# Patient Record
Sex: Male | Born: 1940 | Race: White | Hispanic: No | Marital: Married | Smoking: Never smoker
Health system: Southern US, Community
[De-identification: ages and names within clinical notes are randomized; demographics above are authoritative.]

## PROBLEM LIST (undated history)

## (undated) DIAGNOSIS — H269 Unspecified cataract: Secondary | ICD-10-CM

## (undated) DIAGNOSIS — I10 Essential (primary) hypertension: Secondary | ICD-10-CM

## (undated) DIAGNOSIS — B958 Unspecified staphylococcus as the cause of diseases classified elsewhere: Secondary | ICD-10-CM

## (undated) DIAGNOSIS — H547 Unspecified visual loss: Secondary | ICD-10-CM

## (undated) DIAGNOSIS — I639 Cerebral infarction, unspecified: Secondary | ICD-10-CM

## (undated) DIAGNOSIS — I4891 Unspecified atrial fibrillation: Secondary | ICD-10-CM

## (undated) DIAGNOSIS — E785 Hyperlipidemia, unspecified: Secondary | ICD-10-CM

## (undated) DIAGNOSIS — G4730 Sleep apnea, unspecified: Secondary | ICD-10-CM

## (undated) DIAGNOSIS — H9190 Unspecified hearing loss, unspecified ear: Secondary | ICD-10-CM

## (undated) DIAGNOSIS — Z9079 Acquired absence of other genital organ(s): Secondary | ICD-10-CM

## (undated) DIAGNOSIS — Q282 Arteriovenous malformation of cerebral vessels: Secondary | ICD-10-CM

## (undated) HISTORY — DX: Hyperlipidemia, unspecified: E78.5

## (undated) HISTORY — PX: CARDIAC CATHETERIZATION: SHX172

## (undated) HISTORY — DX: Cerebral infarction, unspecified: I63.9

## (undated) HISTORY — DX: Unspecified atrial fibrillation: I48.91

## (undated) HISTORY — DX: Unspecified hearing loss, unspecified ear: H91.90

## (undated) HISTORY — PX: OTHER SURGICAL HISTORY: SHX169

## (undated) HISTORY — PX: PROSTATECTOMY: SHX69

## (undated) HISTORY — DX: Unspecified visual loss: H54.7

## (undated) HISTORY — DX: Essential (primary) hypertension: I10

---
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
## 2009-04-06 HISTORY — PX: BRAIN AVM REPAIR: SHX202

## 2011-07-12 ENCOUNTER — Emergency Department: Payer: Self-pay | Admitting: Emergency Medicine

## 2011-07-14 ENCOUNTER — Emergency Department: Payer: Self-pay | Admitting: Emergency Medicine

## 2011-07-14 LAB — CBC
HGB: 15.6 g/dL (ref 13.0–18.0)
Platelet: 126 10*3/uL — ABNORMAL LOW (ref 150–440)
RBC: 4.64 10*6/uL (ref 4.40–5.90)
RDW: 11.9 % (ref 11.5–14.5)

## 2011-07-14 LAB — COMPREHENSIVE METABOLIC PANEL
Anion Gap: 7 (ref 7–16)
Bilirubin,Total: 1.1 mg/dL — ABNORMAL HIGH (ref 0.2–1.0)
Calcium, Total: 9.2 mg/dL (ref 8.5–10.1)
Chloride: 94 mmol/L — ABNORMAL LOW (ref 98–107)
EGFR (Non-African Amer.): >60
Potassium: 4.6 mmol/L (ref 3.5–5.1)
SGOT(AST): 45 U/L — ABNORMAL HIGH (ref 15–37)
SGPT (ALT): 26 U/L
Sodium: 131 mmol/L — ABNORMAL LOW (ref 136–145)

## 2011-07-14 LAB — TROPONIN I: Troponin-I: 0.03 ng/mL

## 2012-04-06 HISTORY — PX: BRAIN AVM REPAIR: SHX202

## 2013-08-16 ENCOUNTER — Ambulatory Visit: Payer: Self-pay | Admitting: Ophthalmology

## 2014-02-04 DIAGNOSIS — I639 Cerebral infarction, unspecified: Secondary | ICD-10-CM

## 2014-02-04 HISTORY — DX: Cerebral infarction, unspecified: I63.9

## 2014-03-05 ENCOUNTER — Emergency Department (HOSPITAL_COMMUNITY): Payer: Medicare HMO

## 2014-03-05 ENCOUNTER — Inpatient Hospital Stay (HOSPITAL_COMMUNITY)
Admission: EM | Admit: 2014-03-05 | Discharge: 2014-03-07 | DRG: 064 | Disposition: A | Discharge status: Skilled Nursing Facility | Payer: Medicare HMO | Attending: Internal Medicine | Admitting: Internal Medicine

## 2014-03-05 ENCOUNTER — Inpatient Hospital Stay (HOSPITAL_COMMUNITY): Payer: Medicare HMO

## 2014-03-05 ENCOUNTER — Encounter (HOSPITAL_COMMUNITY): Payer: Self-pay | Admitting: Emergency Medicine

## 2014-03-05 DIAGNOSIS — H540 Blindness, both eyes: Secondary | ICD-10-CM | POA: Diagnosis present

## 2014-03-05 DIAGNOSIS — Z833 Family history of diabetes mellitus: Secondary | ICD-10-CM | POA: Diagnosis not present

## 2014-03-05 DIAGNOSIS — R200 Anesthesia of skin: Secondary | ICD-10-CM

## 2014-03-05 DIAGNOSIS — E669 Obesity, unspecified: Secondary | ICD-10-CM | POA: Diagnosis present

## 2014-03-05 DIAGNOSIS — I10 Essential (primary) hypertension: Secondary | ICD-10-CM

## 2014-03-05 DIAGNOSIS — I739 Peripheral vascular disease, unspecified: Secondary | ICD-10-CM | POA: Diagnosis present

## 2014-03-05 DIAGNOSIS — I6523 Occlusion and stenosis of bilateral carotid arteries: Secondary | ICD-10-CM | POA: Diagnosis present

## 2014-03-05 DIAGNOSIS — R202 Paresthesia of skin: Secondary | ICD-10-CM | POA: Diagnosis present

## 2014-03-05 DIAGNOSIS — Z9079 Acquired absence of other genital organ(s): Secondary | ICD-10-CM | POA: Diagnosis present

## 2014-03-05 DIAGNOSIS — Z820 Family history of epilepsy and other diseases of the nervous system: Secondary | ICD-10-CM

## 2014-03-05 DIAGNOSIS — Q2730 Arteriovenous malformation, site unspecified: Secondary | ICD-10-CM

## 2014-03-05 DIAGNOSIS — E785 Hyperlipidemia, unspecified: Secondary | ICD-10-CM | POA: Diagnosis present

## 2014-03-05 DIAGNOSIS — I6340 Cerebral infarction due to embolism of unspecified cerebral artery: Secondary | ICD-10-CM | POA: Diagnosis not present

## 2014-03-05 DIAGNOSIS — Z8673 Personal history of transient ischemic attack (TIA), and cerebral infarction without residual deficits: Secondary | ICD-10-CM

## 2014-03-05 DIAGNOSIS — G4733 Obstructive sleep apnea (adult) (pediatric): Secondary | ICD-10-CM

## 2014-03-05 DIAGNOSIS — I639 Cerebral infarction, unspecified: Secondary | ICD-10-CM

## 2014-03-05 DIAGNOSIS — Z6830 Body mass index (BMI) 30.0-30.9, adult: Secondary | ICD-10-CM | POA: Diagnosis not present

## 2014-03-05 DIAGNOSIS — G459 Transient cerebral ischemic attack, unspecified: Secondary | ICD-10-CM

## 2014-03-05 DIAGNOSIS — Q282 Arteriovenous malformation of cerebral vessels: Secondary | ICD-10-CM

## 2014-03-05 DIAGNOSIS — G453 Amaurosis fugax: Secondary | ICD-10-CM

## 2014-03-05 HISTORY — DX: Arteriovenous malformation of cerebral vessels: Q28.2

## 2014-03-05 HISTORY — DX: Acquired absence of other genital organ(s): Z90.79

## 2014-03-05 HISTORY — DX: Unspecified staphylococcus as the cause of diseases classified elsewhere: B95.8

## 2014-03-05 HISTORY — DX: Unspecified visual loss: H54.7

## 2014-03-05 HISTORY — DX: Unspecified cataract: H26.9

## 2014-03-05 LAB — RAPID URINE DRUG SCREEN, HOSP PERFORMED
Amphetamines: NOT DETECTED
Barbiturates: NOT DETECTED
Benzodiazepines: NOT DETECTED
Cocaine: NOT DETECTED
Opiates: NOT DETECTED
Tetrahydrocannabinol: NOT DETECTED

## 2014-03-05 LAB — URINALYSIS, ROUTINE W REFLEX MICROSCOPIC
Bilirubin Urine: NEGATIVE
Glucose, UA: NEGATIVE mg/dL
Hgb urine dipstick: NEGATIVE
Ketones, ur: NEGATIVE mg/dL
Leukocytes, UA: NEGATIVE
Nitrite: NEGATIVE
Protein, ur: NEGATIVE mg/dL
Specific Gravity, Urine: 1.014 (ref 1.005–1.030)
Urobilinogen, UA: 0.2 mg/dL (ref 0.0–1.0)
pH: 6 (ref 5.0–8.0)

## 2014-03-05 LAB — CBC WITH DIFFERENTIAL/PLATELET
Basophils Absolute: 0 10*3/uL (ref 0.0–0.1)
Basophils Relative: 0 % (ref 0–1)
Eosinophils Absolute: 0.1 10*3/uL (ref 0.0–0.7)
Eosinophils Relative: 2 % (ref 0–5)
HCT: 47.9 % (ref 39.0–52.0)
Hemoglobin: 16.7 g/dL (ref 13.0–17.0)
Lymphocytes Relative: 29 % (ref 12–46)
Lymphs Abs: 1.9 10*3/uL (ref 0.7–4.0)
MCH: 33.3 pg (ref 26.0–34.0)
MCHC: 34.9 g/dL (ref 30.0–36.0)
MCV: 95.4 fL (ref 78.0–100.0)
Monocytes Absolute: 0.7 10*3/uL (ref 0.1–1.0)
Monocytes Relative: 11 % (ref 3–12)
Neutro Abs: 3.6 10*3/uL (ref 1.7–7.7)
Neutrophils Relative %: 58 % (ref 43–77)
Platelets: 168 10*3/uL (ref 150–400)
RBC: 5.02 MIL/uL (ref 4.22–5.81)
RDW: 12.2 % (ref 11.5–15.5)
WBC: 6.4 10*3/uL (ref 4.0–10.5)

## 2014-03-05 LAB — BASIC METABOLIC PANEL
Anion gap: 10 (ref 5–15)
BUN: 23 mg/dL (ref 6–23)
CO2: 28 mEq/L (ref 19–32)
Calcium: 9.3 mg/dL (ref 8.4–10.5)
Chloride: 102 mEq/L (ref 96–112)
Creatinine, Ser: 0.98 mg/dL (ref 0.50–1.35)
GFR calc Af Amer: >90 mL/min (ref >90)
GFR calc non Af Amer: 81 mL/min — ABNORMAL LOW (ref >90)
Glucose, Bld: 93 mg/dL (ref 70–99)
Potassium: 5 mEq/L (ref 3.7–5.3)
Sodium: 140 mEq/L (ref 137–147)

## 2014-03-05 LAB — CBG MONITORING, ED: Glucose-Capillary: 98 mg/dL (ref 70–99)

## 2014-03-05 LAB — ETHANOL: Alcohol, Ethyl (B): <11 mg/dL (ref 0–11)

## 2014-03-05 MED ORDER — IOHEXOL 350 MG/ML SOLN
80.0000 mL | Freq: Once | INTRAVENOUS | Status: AC | PRN
  Administered 2014-03-05: 80 mL via INTRAVENOUS

## 2014-03-05 MED ORDER — ASPIRIN 325 MG PO TABS
325.0000 mg | ORAL_TABLET | Freq: Every day | ORAL | Status: DC
  Administered 2014-03-06 – 2014-03-07 (×2): 325 mg via ORAL
  Filled 2014-03-05 (×2): qty 1

## 2014-03-05 MED ORDER — HEPARIN SODIUM (PORCINE) 5000 UNIT/ML IJ SOLN
5000.0000 [IU] | Freq: Three times a day (TID) | INTRAMUSCULAR | Status: DC
  Administered 2014-03-05 – 2014-03-07 (×5): 5000 [IU] via SUBCUTANEOUS
  Filled 2014-03-05 (×5): qty 1

## 2014-03-05 MED ORDER — STROKE: EARLY STAGES OF RECOVERY BOOK
Freq: Once | Status: DC
  Filled 2014-03-05: qty 1

## 2014-03-05 MED ORDER — SIMVASTATIN 40 MG PO TABS
80.0000 mg | ORAL_TABLET | Freq: Every day | ORAL | Status: DC
  Administered 2014-03-06 – 2014-03-07 (×2): 80 mg via ORAL
  Filled 2014-03-05 (×2): qty 2

## 2014-03-05 NOTE — ED Notes (Signed)
Irena Cords, [place] has assessed patient for possible stroke.

## 2014-03-05 NOTE — ED Notes (Signed)
Pt c/o right leg cramp. Admitting MD aware, no new orders.

## 2014-03-05 NOTE — ED Notes (Signed)
Per [facility], pt is from home. Roughly 30 minutes again, experienced numbed to right side, face, arm, and leg. Pt describes arm feeling "heavy" right now. Pt had a TIA in 2010. 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 LSN roughly around 1200 today. Pt states symptoms are not completely resolved, still feels mild numbness in right side. VSS, pt is AAOX4, ambulatory, speech clear, VSS. 140/72, 72 HR, CBG 139

## 2014-03-05 NOTE — ED Provider Notes (Signed)
CSN: [id number]     Arrival date & time 03/05/14  1340 History   First MD Initiated Contact with Patient 03/05/14 1358     Chief Complaint  Patient presents with  . Stroke Symptoms     (Consider location/radiation/quality/duration/timing/severity/associated sxs/prior Treatment) HPI Patient presents to the emergency department with numbness and heaviness to the right arm and leg.  The patient also feels some tingling in his face.  It is that this started about 1 hour prior to arrival.  Patient has had a history of TIA in the past.  Patient states that he felt like he may be a little off balance, but was unsure.  Patient denies chest pain, shortness of breath, headache, blurred vision, weakness, dizziness, back pain, neck pain, fever, cough, near syncope or syncope. Past Medical History  Diagnosis Date  . TIA (transient ischemic attack)   . AVM (arteriovenous malformation) brain   . 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 History of prostatectomy   . Cataracts, bilateral   . 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 Prostatectomy    . Brain avm repair     No family history on file. History  Substance Use Topics  . Smoking status: Never Smoker   . Smokeless tobacco: Not on file  . Alcohol Use: No    Review of Systems  All other systems negative except as documented in the HPI. All pertinent positives and negatives as reviewed in the HPI.  Allergies  Review of patient's allergies indicates no known allergies.  Home Medications   Prior to Admission medications   Medication Sig Start Date End Date Taking? Authorizing Provider  acetaminophen (TYLENOL ARTHRITIS PAIN) 650 MG CR tablet Take 650 mg by mouth every 8 (eight) hours as needed for pain.   Yes Historical Provider, MD  metoprolol (LOPRESSOR) 100 MG tablet Take 100 mg by mouth daily.   Yes Historical Provider, MD   simvastatin (ZOCOR) 10 MG tablet Take 10 mg by mouth daily.   Yes Historical Provider, MD   BP 169/87 mmHg  Pulse 75  Temp(Src) 97.8 F (36.6 C) (Oral)  Resp 15  SpO2 95% Physical Exam  Constitutional: He is oriented to person, place, and time. He appears well-developed and well-nourished. No distress.  HENT:  Head: Normocephalic and atraumatic.  Mouth/Throat: Oropharynx is clear and moist.  Eyes: Pupils are equal, round, and reactive to light.  Neck: Normal range of motion. Neck supple.  Cardiovascular: Normal rate, regular rhythm and normal heart sounds.  Exam reveals no gallop and no friction rub.   No murmur heard. Pulmonary/Chest: Effort normal and breath sounds normal. No respiratory distress.  Abdominal: Soft. Bowel sounds are normal. He exhibits no distension. There is no tenderness.  Musculoskeletal: He exhibits no edema.  Neurological: He is alert and oriented to person, place, and time. He has normal strength. No sensory deficit. He exhibits normal muscle tone. Coordination normal.  Skin: Skin is warm and dry. No rash noted. No erythema.  Psychiatric: He has a normal mood and affect. His behavior is normal.  Nursing note and vitals reviewed.   ED Course  Procedures (including critical care time) Labs Review Labs Reviewed  CBC WITH DIFFERENTIAL  BASIC METABOLIC PANEL  URINALYSIS, ROUTINE W REFLEX MICROSCOPIC  URINE RAPID DRUG SCREEN (HOSP PERFORMED)  ETHANOL  CBG MONITORING, ED  Imaging Review Ct Head Wo Contrast  03/05/2014   CLINICAL DATA:  Right arm leg and facial numbness. History of arteriovenous malformation.  EXAM: CT HEAD WITHOUT CONTRAST  TECHNIQUE: Contiguous axial images were obtained from the base of the skull through the vertex without intravenous contrast.  COMPARISON:  None.  FINDINGS: Coils are identified in the left occipital lobe for treatment of an arteriovenous malformation. No evidence of acute intracranial abnormality including hemorrhage,  infarct, mass lesion, mass effect, midline shift or abnormal extra-axial fluid collection is identified. There is no hydrocephalus or pneumocephalus the calvarium is intact. Imaged paranasal sinuses and mastoid air cells are clear.  IMPRESSION: No acute finding.  Coils in the left occipital lobe for treatment of an arteriovenous malformation.   Electronically Signed   By: Inge Rise M.D.   On: 03/05/2014 14:47   Patient will need admission to the hospital.  I spoke with the neuro hospitalist who came down right away to see the patient.  They felt that he needed a further stroke workup needed to be admitted to the medicine team.     Brent General, PA-C 03/05/14 [place], MD 03/05/14 1539

## 2014-03-05 NOTE — Consult Note (Addendum)
Referring Physician: Ashok Cordia    Chief Complaint: right sided numbness and weakness  HPI:                                                                                                                                         Maddex Garlitz is an 73 y.o. male with history of posterior left temporal lobe AVM which has been treated at [facility] by neuro oncology in 2013.  Patient is followed closely by [facility] for his AVM.  He states he has residual right field cut from AVM but no other symptoms.  Today he was on the computer at 1200 when he noted right face, arm and leg decreased sensation clumsiness of the right hand and a sensation of weakness in the right arm and leg.  His leg weakness and numbness has improved but he remains to have decreased sensation in his left arm and face. He states he takes ASA but not on a daily basis and he is on no other antiplatelet.    Date last known well: Date: 03/05/2014 Time last known well: Time: 12:00 tPA Given: No: history of both ICH and AVM along with minimal symptoms.   Past Medical History  Diagnosis Date  . TIA (transient ischemic attack)   . AVM (arteriovenous malformation) brain   . 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 History of prostatectomy   . Cataracts, bilateral   . 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 Prostatectomy    . Brain avm repair      No family history on file. Social History:  reports that he has never smoked. He does not have any smokeless tobacco history on file. He reports that he does not drink alcohol. His drug history is not on file.  Allergies: No Known Allergies  Medications:                                                                                                                           No current facility-administered medications for this encounter.   Current Outpatient Prescriptions  Medication Sig  Dispense Refill  . acetaminophen (TYLENOL ARTHRITIS PAIN) 650 MG CR tablet Take 650 mg by mouth every 8 (eight) hours  as needed for pain.    . metoprolol (LOPRESSOR) 100 MG tablet Take 100 mg by mouth daily.    . simvastatin (ZOCOR) 10 MG tablet Take 10 mg by mouth daily.       ROS:                                                                                                                                       History obtained from the patient  General ROS: negative for - chills, fatigue, fever, night sweats, weight gain or weight loss Psychological ROS: negative for - behavioral disorder, hallucinations, memory difficulties, mood swings or suicidal ideation Ophthalmic ROS: negative for - blurry vision, double vision, eye pain or loss of vision ENT ROS: negative for - epistaxis, nasal discharge, oral lesions, sore throat, tinnitus or vertigo Allergy and Immunology ROS: negative for - hives or itchy/watery eyes Hematological and Lymphatic ROS: negative for - bleeding problems, bruising or swollen lymph nodes Endocrine ROS: negative for - galactorrhea, hair pattern changes, polydipsia/polyuria or temperature intolerance Respiratory ROS: negative for - cough, hemoptysis, shortness of breath or wheezing Cardiovascular ROS: negative for - chest pain, dyspnea on exertion, edema or irregular heartbeat Gastrointestinal ROS: negative for - abdominal pain, diarrhea, hematemesis, nausea/vomiting or stool incontinence Genito-Urinary ROS: negative for - dysuria, hematuria, incontinence or urinary frequency/urgency Musculoskeletal ROS: negative for - joint swelling or muscular weakness Neurological ROS: as noted in HPI Dermatological ROS: negative for rash and skin lesion changes  Neurologic Examination:                                                                                                      Blood pressure 158/87, pulse 67, temperature 97.8 F (36.6 C), temperature source Oral, resp.  rate 16, SpO2 100 %.   Physical Exam  Constitutional: He appears well-developed and well-nourished.  Psych: Affect appropriate to situation Eyes: No scleral injection HENT: No OP obstrucion Head: Normocephalic.  Cardiovascular: Normal rate and regular rhythm.  Respiratory: Effort normal and breath sounds normal.  GI: Soft. Bowel sounds are normal. No distension. There is no tenderness.  Skin: WDI  Neuro Exam: Mental Status: Alert, oriented, thought content appropriate.  Speech dysarthric without evidence of aphasia.  Able to follow 3 step commands without difficulty. Cranial Nerves: II: Discs flat bilaterally; Visual fields shows a right field cut (old), pupils equal, round, reactive to light and accommodation III,IV, VI: ptosis not present, extra-ocular motions intact bilaterally V,VII: smile symmetric--slight right NL fold decrease,  facial light touch sensation decreased on the right lower face VIII: hearing normal bilaterally IX,X: gag reflex present XI: bilateral shoulder shrug XII: midline tongue extension without atrophy or fasciculations  Motor: Right : Upper extremity   4/5    Left:     Upper extremity   5/5  Lower extremity   5/5     Lower extremity   5/5 Tone and bulk:normal tone throughout; no atrophy noted Sensory: Pinprick and light touch decreased on the right arm and leg Deep Tendon Reflexes:  Right: Upper Extremity   Left: Upper extremity   biceps (C-5 to C-6) 2/4   biceps (C-5 to C-6) 2/4 tricep (C7) 2/4    triceps (C7) 2/4 Brachioradialis (C6) 2/4  Brachioradialis (C6) 2/4  Lower Extremity Lower Extremity  quadriceps (L-2 to L-4) 2/4   quadriceps (L-2 to L-4) 2/4 Achilles (S1) 1/4   Achilles (S1) 1/4  Plantars: Right: downgoing   Left: downgoing Cerebellar: normal finger-to-nose,  normal heel-to-shin test Gait: not tested due to multiple leads.  CV: pulses palpable throughout    Lab Results: Basic Metabolic Panel: No results for input(s): NA, K,  CL, CO2, GLUCOSE, BUN, CREATININE, CALCIUM, MG, PHOS in the last 168 hours.  Liver Function Tests: No results for input(s): AST, ALT, ALKPHOS, BILITOT, PROT, ALBUMIN in the last 168 hours. No results for input(s): LIPASE, AMYLASE in the last 168 hours. No results for input(s): AMMONIA in the last 168 hours.  CBC: No results for input(s): WBC, NEUTROABS, HGB, HCT, MCV, PLT in the last 168 hours.  Cardiac Enzymes: No results for input(s): CKTOTAL, CKMB, CKMBINDEX, TROPONINI in the last 168 hours.  Lipid Panel: No results for input(s): CHOL, TRIG, HDL, CHOLHDL, VLDL, LDLCALC in the last 168 hours.  CBG:  Recent Labs Lab 03/05/14 1355  GLUCAP 98    Microbiology: No results found for this or any previous visit.  Coagulation Studies: No results for input(s): LABPROT, INR in the last 72 hours.  Imaging: No results found.     Assessment and plan discussed with with attending physician and they are in agreement.    Etta Quill PA-C [facility] [phone]  03/05/2014, 2:28 PM   Assessment: 73 y.o. male with history of left temporal AVM treated and followed closely by [facility].  On exam he shows new onset right face,arm and leg decreased sensation and right hand weakness. Due to history of ICH and AVM along with minimal symptoms tPA was not administered. CT head reviewed and shows no acute bleed or infarct.   There is a questionable history of paroxysmal A fib.   Stroke Risk Factors - hyperlipidemia and hypertension A fib  Recommend: 1. HgbA1c, fasting lipid panel 2. CTA head and neck 3. PT consult, OT consult, Speech consult 4. Echocardiogram 5. Prophylactic therapy-Antiplatelet med: Aspirin - dose 81 mg daily 6. Risk factor modification 7. Telemetry monitoring 8. Frequent neuro checks 9. Stroke team to follow   Patient seen and evaluated. Agree with above assessment and plan. Based on history and exam findings will admit for stroke workup. Not tPA  candidate due to mild symptoms and history of ICH and AVM. Stroke team to follow in the morning.   Jim Like, DO [facility] [phone]  If 7pm- 7am, please page neurology on call as listed in [place].

## 2014-03-05 NOTE — ED Notes (Signed)
Neurology at bedside.

## 2014-03-05 NOTE — H&P (Signed)
[facility] History and Physical  Charles Joyce CHE:[id number] DOB: [birth year] DOA: 03/05/2014  Referring physician: ED PCP: No primary care provider on file.  Specialists:  Neurology  Chief Complaint: weakness  HPI:  73 y/o h/o OSA, P Afib, Htn, MSSA infection 2102, AVM 06/2009 s/p Embolization  And sterotactic Radiosurgery 18 cgy 01/11/12 [facility], HLD, L choroid hemangioma. Patient was in normal state of health until about 12 PM on 03/05/14 when he felt as if he was more numb on the right side. He had no loss of consciousness but he did feel weaker on the right side. He says he also had numbness of the right arm >leg but he also had some strange dysesthesia to the mouth with his speech and his cheek being numb. He had no chest pain or shortness of breath no visual disturbances other than his prior. He does have right upper quadrant anopsia and this is from his prior coiling of his AVM. He had no fever no chills no cold. 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   Review of Systems: The patient denies any other findings except as above and all other organ systems are negative  Past Medical History  Diagnosis Date  . TIA (transient ischemic attack)   . AVM (arteriovenous malformation) brain   . 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 History of prostatectomy   . Cataracts, bilateral   . 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 Prostatectomy    . Brain avm repair     Social History:  History   Social History Narrative  . No narrative on file    No Known Allergies  Family History  Problem Relation Age of Onset  . Diabetes      Mother  . Alzheimer's disease      Mother     Prior to Admission medications   Medication Sig Start Date End Date Taking? Authorizing Provider  acetaminophen (TYLENOL ARTHRITIS PAIN) 650 MG CR tablet Take 650 mg by mouth every 8 (eight) hours as needed for pain.   Yes Historical Provider, MD  metoprolol (LOPRESSOR) 100 MG tablet Take 100 mg by mouth daily.   Yes Historical Provider, MD  simvastatin (ZOCOR) 10 MG tablet Take 10 mg by mouth daily.   Yes Historical Provider, MD   Physical Exam: Filed Vitals:   03/05/14 1432 03/05/14 1433 03/05/14 1445 03/05/14 1459  BP: 169/87  144/80   Pulse: 71 75 71   Temp:    97.8 F (36.6 C)  TempSrc:      Resp: 11 15 22    SpO2: 96% 95% 96%      General:  Alert doesn't oriented   Eyes: Right upper quadrant quadrantanopsia , pupillary reflexes are normal , fundus seems clear   ENT: Neck soft supple no JVD no bruit  Neck: See above   Cardiovascular: S1-S2 no murmur rub or gallop   Respiratory: Clinically clear   Abdomen: Soft nontender nondistended   Skin: Noted right greater than left side varices   Musculoskeletal: Range of motion intact   Psychiatric: Pleasant euthymic  Neurologic: Power 5/5, reflexes 2/3, sensory is  diminished in the right hand greater than the left hand  No other neurological finding tongue midline, smile symmetric   Labs on Admission:  Basic Metabolic Panel:  Recent Labs Lab 03/05/14 1413  NA 140  K 5.0  CL 102  CO2 28  GLUCOSE 93  BUN 23  CREATININE 0.98  CALCIUM 9.3   Liver Function Tests: No results for input(s): AST, ALT, ALKPHOS, BILITOT, PROT, ALBUMIN in the last 168 hours. No results for input(s): LIPASE, AMYLASE  in the last 168 hours. No results for input(s): AMMONIA in the last 168 hours. CBC:  Recent Labs Lab 03/05/14 1413  WBC 6.4  NEUTROABS 3.6  HGB 16.7  HCT 47.9  MCV 95.4  PLT 168   Cardiac Enzymes: No results for input(s): CKTOTAL, CKMB, CKMBINDEX, TROPONINI in the last 168 hours.  BNP (last 3 results) No results for input(s): PROBNP in the last 8760 hours. CBG:  Recent Labs Lab 03/05/14 1355  GLUCAP 98    Radiological Exams on Admission: Ct Head Wo Contrast  03/05/2014   CLINICAL DATA:  Right arm leg and facial numbness. History of arteriovenous malformation.  EXAM: CT HEAD WITHOUT CONTRAST  TECHNIQUE: Contiguous axial images were obtained from the base of the skull through the vertex without intravenous contrast.  COMPARISON:  None.  FINDINGS: Coils are identified in the left occipital lobe for treatment of an arteriovenous malformation. No evidence of acute intracranial abnormality including hemorrhage, infarct, mass lesion, mass effect, midline shift or abnormal extra-axial fluid collection is identified. There is no hydrocephalus or pneumocephalus the calvarium is intact. Imaged paranasal sinuses and mastoid air cells are clear.  IMPRESSION: No acute finding.  Coils in the left occipital lobe for treatment of an arteriovenous malformation.   Electronically Signed   By: Inge Rise M.D.   On: 03/05/2014 14:47    EKG: Independently reviewed. Sinus rhythm PR interval 0.21  No T-wave inversions. Poor quality EKG  Assessment/Plan Principal Problem:   TIA (transient ischemic attack) workup with CT angiogram given prior AV malformation repaired with coiling. Increased at an dose to 40 mg at high intensity dosing. Okay to give metoprolol now unless shows overt stroke. Active Problems:   OSA (obstructive sleep apnea)-patient may use home BiPAP   AVM (arteriovenous malformation) as above-   HTN (hypertension)-continue metoprolol 100 daily   HLD (hyperlipidemia)-increase  Zocor to 80 mg high intensity may need to transition to Pravachol   prior history of MSSA-needs testing   Primary history of paroxysmal atrial fibrillation-need to inquire with patient regarding control of the same. His Chad2 score is above 2 so might benefit from full dose [facility] which I will defer to Neurology given possible acute CVA  Full code Discussed wife bedside Tele bed Time spent: [place], [facility] Pager [phone]  If 7PM-7AM, please contact night-coverage [email or web address] Password [email or web address] 03/05/2014, 3:41 PM

## 2014-03-05 NOTE — ED Notes (Signed)
Pt transported to CT ?

## 2014-03-06 ENCOUNTER — Inpatient Hospital Stay (HOSPITAL_COMMUNITY): Payer: Medicare HMO

## 2014-03-06 DIAGNOSIS — I379 Nonrheumatic pulmonary valve disorder, unspecified: Secondary | ICD-10-CM

## 2014-03-06 DIAGNOSIS — G459 Transient cerebral ischemic attack, unspecified: Secondary | ICD-10-CM

## 2014-03-06 DIAGNOSIS — I6340 Cerebral infarction due to embolism of unspecified cerebral artery: Principal | ICD-10-CM

## 2014-03-06 DIAGNOSIS — R202 Paresthesia of skin: Secondary | ICD-10-CM

## 2014-03-06 DIAGNOSIS — I639 Cerebral infarction, unspecified: Secondary | ICD-10-CM | POA: Insufficient documentation

## 2014-03-06 LAB — LIPID PANEL
Cholesterol: 180 mg/dL (ref 0–200)
HDL: 46 mg/dL (ref >39)
LDL Cholesterol: 103 mg/dL — ABNORMAL HIGH (ref 0–99)
TRIGLYCERIDES: 154 mg/dL — AB (ref <150)
Total CHOL/HDL Ratio: 3.9 RATIO
VLDL: 31 mg/dL (ref 0–40)

## 2014-03-06 LAB — RAPID URINE DRUG SCREEN, HOSP PERFORMED
Amphetamines: NOT DETECTED
BENZODIAZEPINES: NOT DETECTED
Barbiturates: NOT DETECTED
Cocaine: NOT DETECTED
OPIATES: NOT DETECTED
Tetrahydrocannabinol: NOT DETECTED

## 2014-03-06 LAB — HEMOGLOBIN A1C
HEMOGLOBIN A1C: 5.5 % (ref <5.7)
Mean Plasma Glucose: 111 mg/dL (ref <117)

## 2014-03-06 LAB — GLUCOSE, CAPILLARY
Glucose-Capillary: 121 mg/dL — ABNORMAL HIGH (ref 70–99)
Glucose-Capillary: 95 mg/dL (ref 70–99)
Glucose-Capillary: 109 mg/dL — ABNORMAL HIGH (ref 70–99)

## 2014-03-06 NOTE — Progress Notes (Addendum)
[facility] PROGRESS NOTE   Charles Joyce WSF:[id number] DOB: [birth year] DOA: 03/05/2014 PCP: No primary care provider on file.  HPI/Subjective: Still complaining about right upper extremity heaviness tingling and right side of the face tingling.  Assessment/Plan: Principal Problem:   TIA (transient ischemic attack) Active Problems:   OSA (obstructive sleep apnea)   AVM (arteriovenous malformation)   HTN (hypertension)   HLD (hyperlipidemia)   Numbness and tingling of right arm and leg   Embolic stroke    Right-sided weakness/numbness Patient reported weakness, tingling and numbness in the right leg and right arm. CT angiography of the head was obtained and showed no acute events. Neurologic consulted. MRI ordered and pending. Patient is not on any antiplatelets, started on aspirin. PT/OT to evaluate and treat.  History of atrial fibrillation Currently sinus rhythm, if MRI of brain showed a stroke patient might need full anticoagupatient mentioned he has transient atrial fibrillation while he was critically ill in the hospital about 2-3 years ago. Unclear if he will need anticoagulation, if MRI shows stroke, might consider anticoagulation.   History of AVM Status post coiling at [facility].   OSA Stable, may use his home CPAP.  HTN Stable  Hyperlipidemia LDL is 103, continue Zocor.   Code Status: Full code Family Communication: Plan discussed with the patient. Disposition Plan: Remains inpatient   Consultants:  Neuro  Procedures:  None  Antibiotics:  None   Objective: Filed Vitals:   03/06/14 1052  BP: 126/68  Pulse: 63  Temp: 98 F (36.7 C)  Resp: 18    Intake/Output Summary (Last 24 hours) at 03/06/14 1412 Last data filed at 03/06/14 0842  Gross per 24 hour  Intake    240 ml  Output    425 ml  Net   -185 ml   Filed Weights   03/05/14 1730  Weight: 81.784 kg (180 lb 4.8 oz)    Exam: General: Alert and awake, oriented x3, not  in any acute distress. HEENT: anicteric sclera, pupils reactive to light and accommodation, EOMI CVS: S1-S2 clear, no murmur rubs or gallops Chest: clear to auscultation bilaterally, no wheezing, rales or rhonchi Abdomen: soft nontender, nondistended, normal bowel sounds, no organomegaly Extremities: no cyanosis, clubbing or edema noted bilaterally Neuro: Cranial nerves II-XII intact, no focal neurological deficits  Data Reviewed: Basic Metabolic Panel:  Recent Labs Lab 03/05/14 1413  NA 140  K 5.0  CL 102  CO2 28  GLUCOSE 93  BUN 23  CREATININE 0.98  CALCIUM 9.3   Liver Function Tests: No results for input(s): AST, ALT, ALKPHOS, BILITOT, PROT, ALBUMIN in the last 168 hours. No results for input(s): LIPASE, AMYLASE in the last 168 hours. No results for input(s): AMMONIA in the last 168 hours. CBC:  Recent Labs Lab 03/05/14 1413  WBC 6.4  NEUTROABS 3.6  HGB 16.7  HCT 47.9  MCV 95.4  PLT 168   Cardiac Enzymes: No results for input(s): CKTOTAL, CKMB, CKMBINDEX, TROPONINI in the last 168 hours. BNP (last 3 results) No results for input(s): PROBNP in the last 8760 hours. CBG:  Recent Labs Lab 03/05/14 1355 03/05/14 2105 03/06/14 1136  GLUCAP 98 121* 109*    Micro No results found for this or any previous visit (from the past 240 hour(s)).   Studies: Ct Angio Head W/cm &/or Wo Cm  03/05/2014   CLINICAL DATA:  Episode of right-sided weakness. The symptoms have since resolved. Personal history of the AVM treated with coils.  EXAM: CT ANGIOGRAPHY HEAD  AND NECK  TECHNIQUE: Multidetector CT imaging of the head and neck was performed using the standard protocol during bolus administration of intravenous contrast. Multiplanar CT image reconstructions and MIPs were obtained to evaluate the vascular anatomy. Carotid stenosis measurements (when applicable) are obtained utilizing NASCET criteria, using the distal internal carotid diameter as the denominator.  CONTRAST:  41mL  OMNIPAQUE IOHEXOL 350 MG/ML SOLN  COMPARISON:  CT head without contrast 03/05/2014.  FINDINGS: CTA HEAD FINDINGS  The source images demonstrate no evidence for acute or subacute infarction. Liquid embolic agent is noted within a left occipital lobe AVM. There are no residual enlarged feeding or draining vessels.  The internal carotid arteries are within normal limits to the ICA termini bilaterally. The A1 and M1 segments are normal. No definite anterior communicating artery is present.  The MCA bifurcations are intact. There is attenuation of anterior MCA branch vessels bilaterally, left greater than right. The ACA branch vessels are intact.  The left vertebral artery is the dominant vessel. Both posterior cerebral arteries originate from the basilar tip. The basilar artery is normal. There is a moderate stenosis of the proximal left posterior cerebral artery with significant attenuation of distal branch vessels.  The dural sinuses are patent bilaterally. Right transverse sinus is dominant.  Review of the MIP images confirms the above findings.  CTA NECK FINDINGS  A 3 vessel arch configuration is present. Made vertebral arteries originate from the subclavian arteries. There dense calcifications at the origin of the right vertebral artery. The left vertebral artery is the dominant vessel. There is mild narrowing of the the more distal right vertebral artery at the C2 level. Atherosclerotic calcifications are present at dural margin of the dominant left vertebral artery without other stenoses in the neck.  The right common carotid artery is within normal limits. The bifurcation is within normal limits. There is marked tortuosity of the cervical right internal carotid artery without focal stenosis.  The left common carotid artery is within normal limits. Noncalcified plaque is present at the left carotid bifurcation. The proximal internal carotid artery is narrowed to 2.0 mm, less than 50% stenosis relative to the more  distal vessel. There is marked tortuosity of the midcervical left ICA without a significant stenosis.  Soft tissues the neck are otherwise unremarkable. The bone windows demonstrate mild endplate degenerative changes at C5-6 and C6-7.  Review of the MIP images confirms the above findings.  IMPRESSION: 1. Status post embolization of left occipital lobe AVM without evidence for recurrence. 2. Moderate small vessel attenuation of anterior MCA branch vessels, left greater than right. No significant focal proximal stenosis or occlusion is evident. 3. 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 4. 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 The left vertebral artery is dominant. 5. Less than 50% narrowing of the proximal left internal carotid artery. 6. Marked tortuosity of the cervical internal carotid arteries bilaterally without focal stenosis.   Electronically Signed   By: Lawrence Santiago M.D.   On: 03/05/2014 17:45   Ct Head Wo Contrast  03/05/2014   CLINICAL DATA:  Right arm leg and facial numbness. History of arteriovenous malformation.  EXAM: CT HEAD WITHOUT CONTRAST  TECHNIQUE: Contiguous axial images were obtained from the base of the skull through the vertex without intravenous contrast.  COMPARISON:  None.  FINDINGS: Coils are identified in the left occipital lobe for treatment of an arteriovenous malformation. No evidence of acute intracranial abnormality including hemorrhage, infarct, mass lesion, mass effect,  midline shift or abnormal extra-axial fluid collection is identified. There is no hydrocephalus or pneumocephalus the calvarium is intact. Imaged paranasal sinuses and mastoid air cells are clear.  IMPRESSION: No acute finding.  Coils in the left occipital lobe for treatment of an arteriovenous malformation.   Electronically Signed   By: Inge Rise M.D.   On: 03/05/2014 14:47   Ct Angio Neck W/cm &/or Wo/cm  03/05/2014   CLINICAL DATA:  Episode of  right-sided weakness. The symptoms have since resolved. Personal history of the AVM treated with coils.  EXAM: CT ANGIOGRAPHY HEAD AND NECK  TECHNIQUE: Multidetector CT imaging of the head and neck was performed using the standard protocol during bolus administration of intravenous contrast. Multiplanar CT image reconstructions and MIPs were obtained to evaluate the vascular anatomy. Carotid stenosis measurements (when applicable) are obtained utilizing NASCET criteria, using the distal internal carotid diameter as the denominator.  CONTRAST:  55mL OMNIPAQUE IOHEXOL 350 MG/ML SOLN  COMPARISON:  CT head without contrast 03/05/2014.  FINDINGS: CTA HEAD FINDINGS  The source images demonstrate no evidence for acute or subacute infarction. Liquid embolic agent is noted within a left occipital lobe AVM. There are no residual enlarged feeding or draining vessels.  The internal carotid arteries are within normal limits to the ICA termini bilaterally. The A1 and M1 segments are normal. No definite anterior communicating artery is present.  The MCA bifurcations are intact. There is attenuation of anterior MCA branch vessels bilaterally, left greater than right. The ACA branch vessels are intact.  The left vertebral artery is the dominant vessel. Both posterior cerebral arteries originate from the basilar tip. The basilar artery is normal. There is a moderate stenosis of the proximal left posterior cerebral artery with significant attenuation of distal branch vessels.  The dural sinuses are patent bilaterally. Right transverse sinus is dominant.  Review of the MIP images confirms the above findings.  CTA NECK FINDINGS  A 3 vessel arch configuration is present. Made vertebral arteries originate from the subclavian arteries. There dense calcifications at the origin of the right vertebral artery. The left vertebral artery is the dominant vessel. There is mild narrowing of the the more distal right vertebral artery at the C2  level. Atherosclerotic calcifications are present at dural margin of the dominant left vertebral artery without other stenoses in the neck.  The right common carotid artery is within normal limits. The bifurcation is within normal limits. There is marked tortuosity of the cervical right internal carotid artery without focal stenosis.  The left common carotid artery is within normal limits. Noncalcified plaque is present at the left carotid bifurcation. The proximal internal carotid artery is narrowed to 2.0 mm, less than 50% stenosis relative to the more distal vessel. There is marked tortuosity of the midcervical left ICA without a significant stenosis.  Soft tissues the neck are otherwise unremarkable. The bone windows demonstrate mild endplate degenerative changes at C5-6 and C6-7.  Review of the MIP images confirms the above findings.  IMPRESSION: 1. Status post embolization of left occipital lobe AVM without evidence for recurrence. 2. Moderate small vessel attenuation of anterior MCA branch vessels, left greater than right. No significant focal proximal stenosis or occlusion is evident. 3. 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 4. 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 The left vertebral artery is dominant. 5. Less than 50% narrowing of the proximal left internal carotid artery. 6. Marked tortuosity of the cervical internal carotid arteries bilaterally without  focal stenosis.   Electronically Signed   By: Lawrence Santiago M.D.   On: 03/05/2014 17:45    Scheduled Meds: .  stroke: mapping our early stages of recovery book   Does not apply Once  . aspirin  325 mg Oral Daily  . heparin  5,000 Units Subcutaneous 3 times per day  . simvastatin  80 mg Oral Daily   Continuous Infusions:      Time spent: 35 minutes    [facility]  [facility] Pager [phone] If 7PM-7AM, please contact night-coverage at [email or web address], password [facility] 03/06/2014,  2:12 PM  LOS: 1 day

## 2014-03-06 NOTE — Progress Notes (Signed)
STROKE TEAM PROGRESS NOTE   HISTORY Charles Joyce is an 73 y.o. male with history of posterior left temporal lobe AVM which has been treated at [facility] by neuro oncology in 2013. Patient is followed closely by [facility] for his AVM. He states he has residual right field cut from AVM but no other symptoms. Today 03/05/2012 he was on the computer at 1200 when he noted right face, arm and leg decreased sensation clumsiness of the right hand and a sensation of weakness in the right arm and leg. His leg weakness and numbness has improved but he remains to have decreased sensation in his left arm and face. He states he takes ASA but not on a daily basis and he is on no other antiplatelet. Patient was not administered TPA secondary to history of both ICH and AVM along with minimal symptoms. He was admitted for further evaluation and treatment.   SUBJECTIVE (INTERVAL HISTORY) His wife is at the bedside.  Overall he feels his condition is stable.    OBJECTIVE Temp:  [97.3 F (36.3 C)-98.2 F (36.8 C)] 98 F (36.7 C) (12/01 1052) Pulse Rate:  [61-75] 63 (12/01 1052) Cardiac Rhythm:  [-] Normal sinus rhythm (12/01 0850) Resp:  [11-22] 18 (12/01 1052) BP: (106-173)/(54-106) 126/68 mmHg (12/01 1052) SpO2:  [95 %-100 %] 98 % (12/01 1052) Weight:  [81.784 kg (180 lb 4.8 oz)] 81.784 kg (180 lb 4.8 oz) (11/30 1730)   Recent Labs Lab 03/05/14 1355 03/05/14 2105  GLUCAP 98 121*    Recent Labs Lab 03/05/14 1413  NA 140  K 5.0  CL 102  CO2 28  GLUCOSE 93  BUN 23  CREATININE 0.98  CALCIUM 9.3   No results for input(s): AST, ALT, ALKPHOS, BILITOT, PROT, ALBUMIN in the last 168 hours.  Recent Labs Lab 03/05/14 1413  WBC 6.4  NEUTROABS 3.6  HGB 16.7  HCT 47.9  MCV 95.4  PLT 168   No results for input(s): CKTOTAL, CKMB, CKMBINDEX, TROPONINI in the last 168 hours. No results for input(s): LABPROT, INR in the last 72 hours.  Recent Labs  03/05/14 1533  COLORURINE YELLOW  LABSPEC  1.014  PHURINE 6.0  GLUCOSEU NEGATIVE  HGBUR NEGATIVE  BILIRUBINUR NEGATIVE  KETONESUR NEGATIVE  PROTEINUR NEGATIVE  UROBILINOGEN 0.2  NITRITE NEGATIVE  LEUKOCYTESUR NEGATIVE       Component Value Date/Time   CHOL 180 03/06/2014 0446   TRIG 154* 03/06/2014 0446   HDL 46 03/06/2014 0446   CHOLHDL 3.9 03/06/2014 0446   VLDL 31 03/06/2014 0446   LDLCALC 103* 03/06/2014 0446   Lab Results  Component Value Date   HGBA1C 5.5 03/05/2014      Component Value Date/Time   LABOPIA NONE DETECTED 03/06/2014 0557   COCAINSCRNUR NONE DETECTED 03/06/2014 0557   LABBENZ NONE DETECTED 03/06/2014 0557   AMPHETMU NONE DETECTED 03/06/2014 0557   THCU NONE DETECTED 03/06/2014 0557   LABBARB NONE DETECTED 03/06/2014 0557     Recent Labs Lab 03/05/14 1413  ETH <11    Ct Angio Head W/cm &/or Wo Cm  03/05/2014   CLINICAL DATA:  Episode of right-sided weakness. The symptoms have since resolved. Personal history of the AVM treated with coils.  EXAM: CT ANGIOGRAPHY HEAD AND NECK  TECHNIQUE: Multidetector CT imaging of the head and neck was performed using the standard protocol during bolus administration of intravenous contrast. Multiplanar CT image reconstructions and MIPs were obtained to evaluate the vascular anatomy. Carotid stenosis measurements (when applicable) are obtained  utilizing NASCET criteria, using the distal internal carotid diameter as the denominator.  CONTRAST:  32mL OMNIPAQUE IOHEXOL 350 MG/ML SOLN  COMPARISON:  CT head without contrast 03/05/2014.  FINDINGS: CTA HEAD FINDINGS  The source images demonstrate no evidence for acute or subacute infarction. Liquid embolic agent is noted within a left occipital lobe AVM. There are no residual enlarged feeding or draining vessels.  The internal carotid arteries are within normal limits to the ICA termini bilaterally. The A1 and M1 segments are normal. No definite anterior communicating artery is present.  The MCA bifurcations are intact.  There is attenuation of anterior MCA branch vessels bilaterally, left greater than right. The ACA branch vessels are intact.  The left vertebral artery is the dominant vessel. Both posterior cerebral arteries originate from the basilar tip. The basilar artery is normal. There is a moderate stenosis of the proximal left posterior cerebral artery with significant attenuation of distal branch vessels.  The dural sinuses are patent bilaterally. Right transverse sinus is dominant.  Review of the MIP images confirms the above findings.  CTA NECK FINDINGS  A 3 vessel arch configuration is present. Made vertebral arteries originate from the subclavian arteries. There dense calcifications at the origin of the right vertebral artery. The left vertebral artery is the dominant vessel. There is mild narrowing of the the more distal right vertebral artery at the C2 level. Atherosclerotic calcifications are present at dural margin of the dominant left vertebral artery without other stenoses in the neck.  The right common carotid artery is within normal limits. The bifurcation is within normal limits. There is marked tortuosity of the cervical right internal carotid artery without focal stenosis.  The left common carotid artery is within normal limits. Noncalcified plaque is present at the left carotid bifurcation. The proximal internal carotid artery is narrowed to 2.0 mm, less than 50% stenosis relative to the more distal vessel. There is marked tortuosity of the midcervical left ICA without a significant stenosis.  Soft tissues the neck are otherwise unremarkable. The bone windows demonstrate mild endplate degenerative changes at C5-6 and C6-7.  Review of the MIP images confirms the above findings.  IMPRESSION: 1. Status post embolization of left occipital lobe AVM without evidence for recurrence. 2. Moderate small vessel attenuation of anterior MCA branch vessels, left greater than right. No significant focal proximal stenosis  or occlusion is evident. 3. 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 4. 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 The left vertebral artery is dominant. 5. Less than 50% narrowing of the proximal left internal carotid artery. 6. Marked tortuosity of the cervical internal carotid arteries bilaterally without focal stenosis.   Electronically Signed   By: Lawrence Santiago M.D.   On: 03/05/2014 17:45   Ct Head Wo Contrast  03/05/2014   CLINICAL DATA:  Right arm leg and facial numbness. History of arteriovenous malformation.  EXAM: CT HEAD WITHOUT CONTRAST  TECHNIQUE: Contiguous axial images were obtained from the base of the skull through the vertex without intravenous contrast.  COMPARISON:  None.  FINDINGS: Coils are identified in the left occipital lobe for treatment of an arteriovenous malformation. No evidence of acute intracranial abnormality including hemorrhage, infarct, mass lesion, mass effect, midline shift or abnormal extra-axial fluid collection is identified. There is no hydrocephalus or pneumocephalus the calvarium is intact. Imaged paranasal sinuses and mastoid air cells are clear.  IMPRESSION: No acute finding.  Coils in the left occipital lobe for treatment of an  arteriovenous malformation.   Electronically Signed   By: Inge Rise M.D.   On: 03/05/2014 14:47   Ct Angio Neck W/cm &/or Wo/cm  03/05/2014   CLINICAL DATA:  Episode of right-sided weakness. The symptoms have since resolved. Personal history of the AVM treated with coils.  EXAM: CT ANGIOGRAPHY HEAD AND NECK  TECHNIQUE: Multidetector CT imaging of the head and neck was performed using the standard protocol during bolus administration of intravenous contrast. Multiplanar CT image reconstructions and MIPs were obtained to evaluate the vascular anatomy. Carotid stenosis measurements (when applicable) are obtained utilizing NASCET criteria, using the distal internal carotid diameter as the  denominator.  CONTRAST:  43mL OMNIPAQUE IOHEXOL 350 MG/ML SOLN  COMPARISON:  CT head without contrast 03/05/2014.  FINDINGS: CTA HEAD FINDINGS  The source images demonstrate no evidence for acute or subacute infarction. Liquid embolic agent is noted within a left occipital lobe AVM. There are no residual enlarged feeding or draining vessels.  The internal carotid arteries are within normal limits to the ICA termini bilaterally. The A1 and M1 segments are normal. No definite anterior communicating artery is present.  The MCA bifurcations are intact. There is attenuation of anterior MCA branch vessels bilaterally, left greater than right. The ACA branch vessels are intact.  The left vertebral artery is the dominant vessel. Both posterior cerebral arteries originate from the basilar tip. The basilar artery is normal. There is a moderate stenosis of the proximal left posterior cerebral artery with significant attenuation of distal branch vessels.  The dural sinuses are patent bilaterally. Right transverse sinus is dominant.  Review of the MIP images confirms the above findings.  CTA NECK FINDINGS  A 3 vessel arch configuration is present. Made vertebral arteries originate from the subclavian arteries. There dense calcifications at the origin of the right vertebral artery. The left vertebral artery is the dominant vessel. There is mild narrowing of the the more distal right vertebral artery at the C2 level. Atherosclerotic calcifications are present at dural margin of the dominant left vertebral artery without other stenoses in the neck.  The right common carotid artery is within normal limits. The bifurcation is within normal limits. There is marked tortuosity of the cervical right internal carotid artery without focal stenosis.  The left common carotid artery is within normal limits. Noncalcified plaque is present at the left carotid bifurcation. The proximal internal carotid artery is narrowed to 2.0 mm, less than 50%  stenosis relative to the more distal vessel. There is marked tortuosity of the midcervical left ICA without a significant stenosis.  Soft tissues the neck are otherwise unremarkable. The bone windows demonstrate mild endplate degenerative changes at C5-6 and C6-7.  Review of the MIP images confirms the above findings.  IMPRESSION: 1. Status post embolization of left occipital lobe AVM without evidence for recurrence. 2. Moderate small vessel attenuation of anterior MCA branch vessels, left greater than right. No significant focal proximal stenosis or occlusion is evident. 3. 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 4. 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 The left vertebral artery is dominant. 5. Less than 50% narrowing of the proximal left internal carotid artery. 6. Marked tortuosity of the cervical internal carotid arteries bilaterally without focal stenosis.   Electronically Signed   By: Lawrence Santiago M.D.   On: 03/05/2014 17:45   Carotid Doppler  There is 1-39% bilateral ICA stenosis. Vertebral artery flow is antegrade.    2D Echocardiogram  EF 60-65% with no  source of embolus.    PHYSICAL EXAM Elderly Caucasian male not in distress.Awake alert. Afebrile. Head is nontraumatic. Neck is supple without bruit. Hearing is normal. Cardiac exam no murmur or gallop. Lungs are clear to auscultation. Distal pulses are well felt. Neurological Exam ;  Awake  Alert oriented x 3. Normal speech and language.eye movements full without nystagmus.fundi were not visualized. Vision acuity and fields appear normal. Hearing is normal. Palatal movements are normal. Face symmetric. Tongue midline. Normal strength except mild right hip flexor weakness., tone, reflexes and coordination. Diminished right hemibody sensation. Gait deferred. ASSESSMENT/PLAN Charles Joyce is a 73 y.o. male with history of posterior left temporal lobe AVM presenting with right sided  numbness and weakness. He did not receive IV t-PA due to history of ICH and AVM along with minimal symptoms.   Stroke:  Dominant small left brain infarct, not seen on CT but symptoms remain, felt to be subcortical secondary to small vessel disease source (stroke not felt to be related to AVM)  Resultant  Mild right hemiparesis  MRI  ordered  CT angio head and neck embolization of left occipital AVM without recurrence, small vessel disease, moderate to severe stenosis proximal left posterior cerebral artery, high-grade stenosis proximal right vertebral artery  Carotid Doppler  No significant stenosis   2D Echo  No source of embolus  HgbA1c 5.5  Heparin 5000 units sq tid for VTE prophylaxis  Diet Heart thin liquids  no antithrombotics prior to admission, now on aspirin 325 mg orally every day  Patient counseled to be compliant with his antithrombotic medications  Ongoing aggressive stroke risk factor management  Therapy recommendations:  pending   Disposition:  Return home  Hypertension  Stable  Hyperlipidemia  Home meds:  zocor 10, resumed in hospital  LDL 103, goal < 70  Continue statin at discharge  Other Stroke Risk Factors4  Advanced age  Obesity, Body mass index is 30 kg/(m^2).   Hx TIA  Other Active Problems  Left temporal lobe AVM, Treated at [facility] right upper quadrant due to treatment AVM  Hospital day # Charles Joyce for Pager information 03/06/2014 4:23 PM   I have personally examined this patient, reviewed notes, independently viewed imaging studies, participated in medical decision making and plan of care. I have made any additions or clarifications directly to the above note. Agree with note above. Suspect small left brain infarct/TIA. AVM stable post coiling.Check MRI Antony Contras, MD Medical Director [place] Pager: [phone] 03/06/2014 8:05 PM   To contact Stroke  Continuity provider, please refer to [email or web address] After hours, contact General Neurology

## 2014-03-06 NOTE — Plan of Care (Signed)
Problem: Acute Treatment Outcomes Goal: Neuro exam at baseline or improved Outcome: Progressing Goal: BP within ordered parameters Outcome: Progressing Goal: Airway maintained/protected Outcome: Progressing Goal: 02 Sats > 94% Outcome: Progressing Goal: Hemodynamically stable Outcome: Progressing

## 2014-03-06 NOTE — Plan of Care (Signed)
Problem: Consults Goal: Ischemic Stroke Patient Education See Patient Education Module for education specifics.  Outcome: Completed/Met Date Met:  03/06/14 Goal: Skin Care Protocol Initiated - if Braden Score 18 or less If consults are not indicated, leave blank or document N/A  Outcome: Not Applicable Date Met:  03/06/14 Goal: Nutrition Consult-if indicated Outcome: Not Applicable Date Met:  03/06/14 Goal: Diabetes Guidelines if Diabetic/Glucose > 140 If diabetic or lab glucose is > 140 mg/dl - Initiate Diabetes/Hyperglycemia Guidelines & Document Interventions  Outcome: Not Applicable Date Met:  03/06/14  Problem: Acute Treatment Outcomes Goal: Neuro exam at baseline or improved Outcome: Completed/Met Date Met:  03/06/14 Goal: BP within ordered parameters Outcome: Completed/Met Date Met:  03/06/14 Goal: Airway maintained/protected Outcome: Completed/Met Date Met:  03/06/14 Goal: 02 Sats > 94% Outcome: Completed/Met Date Met:  03/06/14 Goal: Hemodynamically stable Outcome: Completed/Met Date Met:  03/06/14 Goal: Prognosis discussed with family/patient as appropriate Outcome: Completed/Met Date Met:  03/06/14 Goal: Other Acute Treatment Outcomes Outcome: Completed/Met Date Met:  03/06/14     

## 2014-03-06 NOTE — Progress Notes (Signed)
  Echocardiogram 2D Echocardiogram has been performed.  Donata Clay 03/06/2014, 10:27 AM

## 2014-03-06 NOTE — Progress Notes (Signed)
*  PRELIMINARY RESULTS* Vascular Ultrasound Carotid Duplex (Doppler) has been completed.  Preliminary findings: Bilateral:  1-39% ICA stenosis.  Vertebral artery flow is antegrade.      Landry Mellow, RDMS, RVT  03/06/2014, 9:22 AM

## 2014-03-06 NOTE — Progress Notes (Signed)
Utilization review completed. Renisha Cockrum, RN, BSN. 

## 2014-03-07 DIAGNOSIS — I639 Cerebral infarction, unspecified: Secondary | ICD-10-CM | POA: Insufficient documentation

## 2014-03-07 LAB — CBC
HCT: 50 % (ref 39.0–52.0)
Hemoglobin: 17.5 g/dL — ABNORMAL HIGH (ref 13.0–17.0)
MCH: 33.2 pg (ref 26.0–34.0)
MCHC: 35 g/dL (ref 30.0–36.0)
MCV: 94.9 fL (ref 78.0–100.0)
Platelets: 162 10*3/uL (ref 150–400)
RBC: 5.27 MIL/uL (ref 4.22–5.81)
RDW: 12.3 % (ref 11.5–15.5)
WBC: 7.4 10*3/uL (ref 4.0–10.5)

## 2014-03-07 LAB — BASIC METABOLIC PANEL
Anion gap: 16 — ABNORMAL HIGH (ref 5–15)
BUN: 21 mg/dL (ref 6–23)
CALCIUM: 9.7 mg/dL (ref 8.4–10.5)
CHLORIDE: 100 meq/L (ref 96–112)
CO2: 21 meq/L (ref 19–32)
Creatinine, Ser: 0.87 mg/dL (ref 0.50–1.35)
GFR calc Af Amer: >90 mL/min (ref >90)
GFR calc non Af Amer: 85 mL/min — ABNORMAL LOW (ref >90)
Glucose, Bld: 114 mg/dL — ABNORMAL HIGH (ref 70–99)
Potassium: 4 mEq/L (ref 3.7–5.3)
SODIUM: 137 meq/L (ref 137–147)

## 2014-03-07 MED ORDER — SIMVASTATIN 20 MG PO TABS: 20.0000 mg | ORAL_TABLET | Freq: Every day | ORAL | Status: DC

## 2014-03-07 MED ORDER — ASPIRIN 325 MG PO TABS: 325.0000 mg | ORAL_TABLET | Freq: Every day | ORAL | Status: AC

## 2014-03-07 NOTE — Progress Notes (Signed)
CARE MANAGEMENT NOTE 03/07/2014  Patient:  DASHAUN, ONSTOTT   Account Number:  [id number]  Date Initiated:  03/07/2014  Documentation initiated by:  [facility]  Subjective/Objective Assessment:   TIA (transient ischemic attack)     Action/Plan:   SNF   Anticipated DC Date:  03/07/2014   Anticipated DC Plan:  [place]  CM consult      Choice offered to / List presented to:             Status of service:  Completed, signed off Medicare Important Message given?  NA - LOS <3 / Initial given by admissions (If response is "NO", the following Medicare IM given date fields will be blank) Date Medicare IM given:   Medicare IM given by:   Date Additional Medicare IM given:   Additional Medicare IM given by:    Discharge Disposition:  [place]  Per UR Regulation:  Reviewed for med. necessity/level of care/duration of stay  If discussed at [place] of Stay Meetings, dates discussed:    Comments:  03/07/2014 1545 NCM spoke to pt. And states he does have insurance. Scheduled dc to SNF. Contacted wife to make aware copy of insurance cards needed. Jonnie Finner RN CCM Case Mgmt phone [phone]

## 2014-03-07 NOTE — Progress Notes (Signed)
OT  Note  Patient Details Name: Charles Joyce MRN: [id number] DOB: Mar 03, 1941   Cancelled Treatment:    Reason Eval/Treat Not Completed: Other (comment) (defer SNF) transport called   Peri Maris  Pager: [phone]  03/07/2014, 1:19 PM

## 2014-03-07 NOTE — Progress Notes (Signed)
Patient is discharged from room 4N23 at this time. Alert and in stable condition. IV site d/c'd as well as tele. Instructions read to patient and understanding verbalized. Left unit via PTAR with all belongings.

## 2014-03-07 NOTE — Progress Notes (Signed)
03/07/2014 1700 Contacted ED registration, faxed insurance info to registration to enter in the system. Jonnie Finner RN CCM Case Mgmt phone [phone]

## 2014-03-07 NOTE — Discharge Summary (Signed)
[facility]  Physician Discharge Summary   Patient ID: Charles Joyce MRN: [id number] DOB/AGE: September 17, 1940 73 y.o.  Admit date: 03/05/2014 Discharge date: 03/07/2014  PCP: Kirk Ruths., MD  DISCHARGE DIAGNOSES:  Principal Problem:   TIA (transient ischemic attack) Active Problems:   OSA (obstructive sleep apnea)   AVM (arteriovenous malformation)   HTN (hypertension)   HLD (hyperlipidemia)   Numbness and tingling of right arm and leg   Embolic stroke   Stroke   RECOMMENDATIONS FOR OUTPATIENT FOLLOW UP: 1. Will need skilled nursing facility for short-term rehabilitation  DISCHARGE CONDITION: fair  Diet recommendation: Heart healthy diet.  Filed Weights   03/05/14 1730  Weight: 81.784 kg (180 lb 4.8 oz)    INITIAL HISTORY: 73 year old Caucasian male with a history of hypertension, intracranial arteriovenous malformation in 2011 , status post embolization. History of paroxysmal atrial fibrillation in the setting of infection in 2013. This was while he was at [facility]. He presented with complaints of right-sided numbness and weakness. He was found to have an acute stroke. He was admitted to the hospital and was seen by neurology.  Consultations:  Neurology  Procedures: Carotid Doppler. Summary: - The vertebral arteries appear patent with antegrade flow. - Findings consistent with 1- 39 percent stenosis involving theright internal carotid artery and the left internal carotidartery.  2-D echocardiogram. Study Conclusions - Left ventricle: The cavity size was normal. Wall thickness wasnormal. Systolic function was normal. The estimated ejectionfraction was in the range of 60% to 65%. Wall motion was normal;there were no regional wall motion abnormalities. Dopplerparameters are consistent with abnormal left ventricularrelaxation (grade 1 diastolic dysfunction).  HOSPITAL COURSE:    Right-sided weakness/numbness MRI revealed left thalamic  infarct. This is thought to be due to small vessel disease or other than embolic event. Discussed with neurology. Plan is to give aspirin 325 mg. Patient was seen by physical and occupational therapy. He will require skilled level of nursing care. He already lives in a retirement center, which has capability to provide higher level of care, as if needed. Social worker has determined that they will accept him with his high level. So he will be discharged today. HbA1c is 5.5. LDL is 103.  History of paroxysmal atrial fibrillation He had only 1 episode. This was in 2013 when he was hospitalized at [facility] with infection. Hasn't had any further episodes. However, doesn't follow-up with cardiology. I have discussed this in detail with him. Due to his history of intracranial bleeding and AVMs we would hesitate to put him on anticoagulation without being absolutely certain that he does get episodes of atrial fibrillation as the risks outweigh benefit. He will discuss this further with his PCP to arrange for either a loop recorder for an event monitor. Echocardiogram results as above.   History of AVM Status post coiling at [facility]. Stable  OSA Stable. Continue CPAP.  Essential HTN Stable  Hyperlipidemia LDL is 103. Continue Zocor, but at a slightly higher dose.   Patient has been cleared by neurology. He can be discharged today.  PERTINENT LABS:  The results of significant diagnostics from this hospitalization (including imaging, microbiology, ancillary and laboratory) are listed below for reference.      Labs: Basic Metabolic Panel:  Recent Labs Lab 03/05/14 1413 03/07/14 0854  NA 140 137  K 5.0 4.0  CL 102 100  CO2 28 21  GLUCOSE 93 114*  BUN 23 21  CREATININE 0.98 0.87  CALCIUM 9.3 9.7   CBC:  Recent  Labs Lab 03/05/14 1413 03/07/14 0854  WBC 6.4 7.4  NEUTROABS 3.6  --   HGB 16.7 17.5*  HCT 47.9 50.0  MCV 95.4 94.9  PLT 168 162   CBG:  Recent Labs Lab  03/05/14 1355 03/05/14 2105 03/06/14 0709 03/06/14 1136  GLUCAP 98 121* 95 109*     IMAGING STUDIES Ct Angio Head W/cm &/or Wo Cm  03/05/2014   CLINICAL DATA:  Episode of right-sided weakness. The symptoms have since resolved. Personal history of the AVM treated with coils.  EXAM: CT ANGIOGRAPHY HEAD AND NECK  TECHNIQUE: Multidetector CT imaging of the head and neck was performed using the standard protocol during bolus administration of intravenous contrast. Multiplanar CT image reconstructions and MIPs were obtained to evaluate the vascular anatomy. Carotid stenosis measurements (when applicable) are obtained utilizing NASCET criteria, using the distal internal carotid diameter as the denominator.  CONTRAST:  75mL OMNIPAQUE IOHEXOL 350 MG/ML SOLN  COMPARISON:  CT head without contrast 03/05/2014.  FINDINGS: CTA HEAD FINDINGS  The source images demonstrate no evidence for acute or subacute infarction. Liquid embolic agent is noted within a left occipital lobe AVM. There are no residual enlarged feeding or draining vessels.  The internal carotid arteries are within normal limits to the ICA termini bilaterally. The A1 and M1 segments are normal. No definite anterior communicating artery is present.  The MCA bifurcations are intact. There is attenuation of anterior MCA branch vessels bilaterally, left greater than right. The ACA branch vessels are intact.  The left vertebral artery is the dominant vessel. Both posterior cerebral arteries originate from the basilar tip. The basilar artery is normal. There is a moderate stenosis of the proximal left posterior cerebral artery with significant attenuation of distal branch vessels.  The dural sinuses are patent bilaterally. Right transverse sinus is dominant.  Review of the MIP images confirms the above findings.  CTA NECK FINDINGS  A 3 vessel arch configuration is present. Made vertebral arteries originate from the subclavian arteries. There dense  calcifications at the origin of the right vertebral artery. The left vertebral artery is the dominant vessel. There is mild narrowing of the the more distal right vertebral artery at the C2 level. Atherosclerotic calcifications are present at dural margin of the dominant left vertebral artery without other stenoses in the neck.  The right common carotid artery is within normal limits. The bifurcation is within normal limits. There is marked tortuosity of the cervical right internal carotid artery without focal stenosis.  The left common carotid artery is within normal limits. Noncalcified plaque is present at the left carotid bifurcation. The proximal internal carotid artery is narrowed to 2.0 mm, less than 50% stenosis relative to the more distal vessel. There is marked tortuosity of the midcervical left ICA without a significant stenosis.  Soft tissues the neck are otherwise unremarkable. The bone windows demonstrate mild endplate degenerative changes at C5-6 and C6-7.  Review of the MIP images confirms the above findings.  IMPRESSION: 1. Status post embolization of left occipital lobe AVM without evidence for recurrence. 2. Moderate small vessel attenuation of anterior MCA branch vessels, left greater than right. No significant focal proximal stenosis or occlusion is evident. 3. 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 4. 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 The left vertebral artery is dominant. 5. Less than 50% narrowing of the proximal left internal carotid artery. 6. Marked tortuosity of the cervical internal carotid arteries bilaterally without focal stenosis.  Electronically Signed   By: Lawrence Santiago M.D.   On: 03/05/2014 17:45   Ct Head Wo Contrast  03/05/2014   CLINICAL DATA:  Right arm leg and facial numbness. History of arteriovenous malformation.  EXAM: CT HEAD WITHOUT CONTRAST  TECHNIQUE: Contiguous axial images were obtained from the base of  the skull through the vertex without intravenous contrast.  COMPARISON:  None.  FINDINGS: Coils are identified in the left occipital lobe for treatment of an arteriovenous malformation. No evidence of acute intracranial abnormality including hemorrhage, infarct, mass lesion, mass effect, midline shift or abnormal extra-axial fluid collection is identified. There is no hydrocephalus or pneumocephalus the calvarium is intact. Imaged paranasal sinuses and mastoid air cells are clear.  IMPRESSION: No acute finding.  Coils in the left occipital lobe for treatment of an arteriovenous malformation.   Electronically Signed   By: Inge Rise M.D.   On: 03/05/2014 14:47   Ct Angio Neck W/cm &/or Wo/cm  03/05/2014   CLINICAL DATA:  Episode of right-sided weakness. The symptoms have since resolved. Personal history of the AVM treated with coils.  EXAM: CT ANGIOGRAPHY HEAD AND NECK  TECHNIQUE: Multidetector CT imaging of the head and neck was performed using the standard protocol during bolus administration of intravenous contrast. Multiplanar CT image reconstructions and MIPs were obtained to evaluate the vascular anatomy. Carotid stenosis measurements (when applicable) are obtained utilizing NASCET criteria, using the distal internal carotid diameter as the denominator.  CONTRAST:  48mL OMNIPAQUE IOHEXOL 350 MG/ML SOLN  COMPARISON:  CT head without contrast 03/05/2014.  FINDINGS: CTA HEAD FINDINGS  The source images demonstrate no evidence for acute or subacute infarction. Liquid embolic agent is noted within a left occipital lobe AVM. There are no residual enlarged feeding or draining vessels.  The internal carotid arteries are within normal limits to the ICA termini bilaterally. The A1 and M1 segments are normal. No definite anterior communicating artery is present.  The MCA bifurcations are intact. There is attenuation of anterior MCA branch vessels bilaterally, left greater than right. The ACA branch vessels are  intact.  The left vertebral artery is the dominant vessel. Both posterior cerebral arteries originate from the basilar tip. The basilar artery is normal. There is a moderate stenosis of the proximal left posterior cerebral artery with significant attenuation of distal branch vessels.  The dural sinuses are patent bilaterally. Right transverse sinus is dominant.  Review of the MIP images confirms the above findings.  CTA NECK FINDINGS  A 3 vessel arch configuration is present. Made vertebral arteries originate from the subclavian arteries. There dense calcifications at the origin of the right vertebral artery. The left vertebral artery is the dominant vessel. There is mild narrowing of the the more distal right vertebral artery at the C2 level. Atherosclerotic calcifications are present at dural margin of the dominant left vertebral artery without other stenoses in the neck.  The right common carotid artery is within normal limits. The bifurcation is within normal limits. There is marked tortuosity of the cervical right internal carotid artery without focal stenosis.  The left common carotid artery is within normal limits. Noncalcified plaque is present at the left carotid bifurcation. The proximal internal carotid artery is narrowed to 2.0 mm, less than 50% stenosis relative to the more distal vessel. There is marked tortuosity of the midcervical left ICA without a significant stenosis.  Soft tissues the neck are otherwise unremarkable. The bone windows demonstrate mild endplate degenerative changes at C5-6 and C6-7.  Review of the MIP images confirms the above findings.  IMPRESSION: 1. Status post embolization of left occipital lobe AVM without evidence for recurrence. 2. Moderate small vessel attenuation of anterior MCA branch vessels, left greater than right. No significant focal proximal stenosis or occlusion is evident. 3. 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 4. High-grade  stenosis the proximal right vertebral artery. The left vertebral artery is dominant. 5. Less than 50% narrowing of the proximal left internal carotid artery. 6. Marked tortuosity of the cervical internal carotid arteries bilaterally without focal stenosis.   Electronically Signed   By: Lawrence Santiago M.D.   On: 03/05/2014 17:45   Mr Brain Wo Contrast  03/06/2014   CLINICAL DATA:  73 year old male with recent episode of right side weakness. Initial encounter. Personal history of intracranial AVM treated with Onyx.  EXAM: MRI HEAD WITHOUT CONTRAST  TECHNIQUE: Multiplanar, multiecho pulse sequences of the brain and surrounding structures were obtained without intravenous contrast.  COMPARISON:  Head and neck CTA 03/05/2014.  FINDINGS: 9 mm focus of restricted diffusion in the central left thalamus, in proximity to the posterior limb left internal capsule (series 3, image 24). Associated T2 and FLAIR hyperintensity. No mass effect or associated hemorrhage.  Major intracranial vascular flow voids are preserved, although there is increased FLAIR signal in the left PCA (series 7, image 92).  No other restricted diffusion. 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 Associated T2* signal loss related to the liquid embolic agent. Left occipital encephalomalacia and gliosis.  Elsewhere gray and white matter signal is within normal limits for age. No ventriculomegaly. No midline shift, mass effect, or evidence of intracranial mass lesion. No acute intracranial hemorrhage identified. Negative pituitary, cervicomedullary junction and visualized cervical spine.  Normal visualized internal auditory structures except for abnormal enlargement of the left horizontal semicircular canal and left vestibule. Mastoids are clear. Mild paranasal sinus mucosal thickening. Postoperative changes to the left globe. Visualized scalp soft tissues are within  normal limits. Normal bone marrow signal.  IMPRESSION: 1. Acute lacunar infarct of the left thalamus. No associated mass effect or hemorrhage. 2. Signal abnormality of the left PCA, corresponding to the segment demonstrating up to severe stenosis on the recent CTA. 3. Sequelae of left occipital lobe AVM treatment.   Electronically Signed   By: Lars Pinks M.D.   On: 03/06/2014 19:55    DISCHARGE EXAMINATION: Filed Vitals:   03/07/14 0140 03/07/14 0548 03/07/14 1000 03/07/14 1328  BP: 107/65 144/57 123/76 134/81  Pulse: 64 74 85 80  Temp: 97.9 F (36.6 C) 97.5 F (36.4 C) 98.2 F (36.8 C) 98.1 F (36.7 C)  TempSrc: Oral Oral Oral Oral  Resp: 18 18 18 20   Height:      Weight:      SpO2: 96% 98% 100% 96%   General appearance: alert, cooperative, appears stated age and no distress Resp: clear to auscultation bilaterally Cardio: regular rate and rhythm, S1, S2 normal, no murmur, click, rub or gallop GI: soft, non-tender; bowel sounds normal; no masses,  no organomegaly Has good strength in the right side. Perhaps subtle weakness.  DISPOSITION: SNF   Discharge Instructions    Ambulatory referral to Neurology    Complete by:  As directed   Stroke patient. Dr. Leonie Man prefers follow up in 1 month           ALLERGIES: No Known Allergies  Current Discharge Medication  List    START taking these medications   Details  aspirin 325 MG tablet Take 1 tablet (325 mg total) by mouth daily. Qty: 30 tablet, Refills: 3      CONTINUE these medications which have CHANGED   Details  simvastatin (ZOCOR) 20 MG tablet Take 1 tablet (20 mg total) by mouth daily. Qty: 30 tablet, Refills: 2      CONTINUE these medications which have NOT CHANGED   Details  acetaminophen (TYLENOL ARTHRITIS PAIN) 650 MG CR tablet Take 650 mg by mouth every 8 (eight) hours as needed for pain.    metoprolol (LOPRESSOR) 100 MG tablet Take 100 mg by mouth daily.       Follow-up Information    Follow up with  SETHI,PRAMOD, MD In 1 month.   Specialties:  Neurology, Radiology   Why:  Stroke Clinic, Office will call you with appointment date & time   Contact information:   [place] [address] [phone]       Follow up with Kirk Ruths., MD. Schedule an appointment as soon as possible for a visit in 1 week.   Specialty:  Internal Medicine   Why:  to discuss loop recorder versus event monitor for history of Atrial fibrillation.   Contact information:   [place] [address] [phone]       TOTAL DISCHARGE TIME: 35 mins  [facility] Pager [phone]  03/07/2014, 1:47 PM

## 2014-03-07 NOTE — Clinical Social Work Psychosocial (Signed)
Clinical Social Work Department BRIEF PSYCHOSOCIAL ASSESSMENT 03/07/2014  Patient:  Charles Joyce, Charles Joyce     Account Number:  [id number]     Admit date:  03/05/2014  Clinical Social Worker:  Marciano Sequin  Date/Time:  03/07/2014 01:27 PM  Referred by:  RN  Date Referred:  03/07/2014 Referred for  SNF Placement   Other Referral:   Interview type:  Patient Other interview type:    PSYCHOSOCIAL DATA Living Status:  FACILITY Admitted from facility:  [facility] Level of care:  Independent Living Primary support name:  [facility] Primary support relationship to patient:  SPOUSE Degree of support available:   Strong Support    CURRENT CONCERNS Current Concerns  None Noted   Other Concerns:    SOCIAL WORK ASSESSMENT / PLAN CSW met the pt at bedside. CSW introduced self and purpose of the visit. CSW and pt discussed the clinical team's recommendations for rehab.  Pt reported being a resident at [facility]. CSW and pt discussed receiving rehab at the skilled level at [facility]. CSW explained the SNF process to the pt. CSW answered all questions in which pt inquired about. CSW provided the pt with contact information for further questions. CSW will continue to follow this pt and assist with discharge as needed.   Assessment/plan status:  Psychosocial Support/Ongoing Assessment of Needs Other assessment/ plan:   Information/referral to community resources:    PATIENT'S/FAMILY'S RESPONSE TO PLAN OF CARE: Pt presented with a happy affect and a lively mood. Pt was oriented 4x. Pt was receptive and coopertaive during visit. Pt reported being happy that he can return back to [facility] to receive rehab.    [place], MSW, [place]

## 2014-03-07 NOTE — Discharge Instructions (Signed)
Ischemic Stroke °A stroke (cerebrovascular accident) is the sudden death of brain tissue. It is a medical emergency. A stroke can cause permanent loss of brain function. This can cause problems with different parts of your body. A transient ischemic attack (TIA) is different because it does not cause permanent damage. A TIA is a short-lived problem of poor blood flow affecting a part of the brain. A TIA is also a serious problem because having a TIA greatly increases the chances of having a stroke. When symptoms first develop, you cannot know if the problem might be a stroke or a TIA. °CAUSES  °A stroke is caused by a decrease of oxygen supply to an area of your brain. It is usually the result of a small blood clot or collection of cholesterol or fat (plaque) that blocks blood flow in the brain. A stroke can also be caused by blocked or damaged carotid arteries.  °RISK FACTORS °· High blood pressure (hypertension). °· High cholesterol. °· Diabetes mellitus. °· Heart disease. °· The buildup of plaque in the blood vessels (peripheral artery disease or atherosclerosis). °· The buildup of plaque in the blood vessels providing blood and oxygen to the brain (carotid artery stenosis). °· An abnormal heart rhythm (atrial fibrillation). °· Obesity. °· Smoking. °· Taking oral contraceptives (especially in combination with smoking). °· Physical inactivity. °· A diet high in fats, salt (sodium), and calories. °· Alcohol use. °· Use of illegal drugs (especially cocaine and methamphetamine). °· Being African American. °· Being over the age of 55. °· Family history of stroke. °· Previous history of blood clots, stroke, TIA, or heart attack. °· Sickle cell disease. °SYMPTOMS  °These symptoms usually develop suddenly, or may be newly present upon awakening from sleep: °· Sudden weakness or numbness of the face, arm, or leg, especially on one side of the body. °· Sudden trouble walking or difficulty moving arms or legs. °· Sudden  confusion. °· Sudden personality changes. °· Trouble speaking (aphasia) or understanding. °· Difficulty swallowing. °· Sudden trouble seeing in one or both eyes. °· Double vision. °· Dizziness. °· Loss of balance or coordination. °· Sudden severe headache with no known cause. °· Trouble reading or writing. °DIAGNOSIS  °Your health care provider can often determine the presence or absence of a stroke based on your symptoms, history, and physical exam. Computed tomography (CT) of the brain is usually performed to confirm the stroke, determine causes, and determine stroke severity. Other tests may be done to find the cause of the stroke. These tests may include: °· Electrocardiography. °· Continuous heart monitoring. °· Echocardiography. °· Carotid ultrasonography. °· Magnetic resonance imaging (MRI). °· A scan of the brain circulation. °· Blood tests. °PREVENTION  °The risk of a stroke can be decreased by appropriately treating high blood pressure, high cholesterol, diabetes, heart disease, and obesity and by quitting smoking, limiting alcohol, and staying physically active. °TREATMENT  °Time is of the essence. It is important to seek treatment at the first sign of these symptoms because you may receive a medicine to dissolve the clot (thrombolytic) that cannot be given if too much time has passed since your symptoms began. Even if you do not know when your symptoms began, get treatment as soon as possible as there are other treatment options available including oxygen, intravenous (IV) fluids, and medicines to thin the blood (anticoagulants). Treatment of stroke depends on the duration, severity, and cause of your symptoms. Medicines and dietary changes may be used to address diabetes, high blood   pressure, and other risk factors. Physical, speech, and occupational therapists will assess you and work with you to improve any functions impaired by the stroke. Measures will be taken to prevent short-term and long-term  complications, including infection from breathing foreign material into the lungs (aspiration pneumonia), blood clots in the legs, bedsores, and falls. Rarely, surgery may be needed to remove large blood clots or to open up blocked arteries. °HOME CARE INSTRUCTIONS  °· Take medicines only as directed by your health care provider. Follow the directions carefully. Medicines may be used to control risk factors for a stroke. Be sure you understand all your medicine instructions. °· You may be told to take a medicine to thin the blood, such as aspirin or the anticoagulant warfarin. Warfarin needs to be taken exactly as instructed. °¨ Too much and too little warfarin are both dangerous. Too much warfarin increases the risk of bleeding. Too little warfarin continues to allow the risk for blood clots. While taking warfarin, you will need to have regular blood tests to measure your blood clotting time. These blood tests usually include both the PT and INR tests. The PT and INR results allow your health care provider to adjust your dose of warfarin. The dose can change for many reasons. It is critically important that you take warfarin exactly as prescribed, and that you have your PT and INR levels drawn exactly as directed. °¨ Many foods, especially foods high in vitamin K, can interfere with warfarin and affect the PT and INR results. Foods high in vitamin K include spinach, kale, broccoli, cabbage, collard and turnip greens, brussels sprouts, peas, cauliflower, seaweed, and parsley, as well as beef and pork liver, green tea, and soybean oil. You should eat a consistent amount of foods high in vitamin K. Avoid major changes in your diet, or notify your health care provider before changing your diet. Arrange a visit with a dietitian to answer your questions. °¨ Many medicines can interfere with warfarin and affect the PT and INR results. You must tell your health care provider about any and all medicines you take. This  includes all vitamins and supplements. Be especially cautious with aspirin and anti-inflammatory medicines. Do not take or discontinue any prescribed or over-the-counter medicine except on the advice of your health care provider or pharmacist. °¨ Warfarin can have side effects, such as excessive bruising or bleeding. You will need to hold pressure over cuts for longer than usual. Your health care provider or pharmacist will discuss other potential side effects. °¨ Avoid sports or activities that may cause injury or bleeding. °¨ Be mindful when shaving, flossing your teeth, or handling sharp objects. °¨ Alcohol can change the body's ability to handle warfarin. It is best to avoid alcoholic drinks or consume only very small amounts while taking warfarin. Notify your health care provider if you change your alcohol intake. °¨ Notify your dentist or other health care providers before procedures. °· If swallow studies have determined that your swallowing reflex is present, you should eat healthy foods. Including 5 or more servings of fruits and vegetables a day may reduce the risk of stroke. Foods may need to be a certain consistency (soft or pureed), or small bites may need to be taken in order to avoid aspirating or choking. Certain dietary changes may be advised to address high blood pressure, high cholesterol, diabetes, or obesity. °¨ Food choices that are low in sodium, saturated fat, trans fat, and cholesterol are recommended to manage high blood pressure. °¨   Food choies that are high in fiber, and low in saturated fat, trans fat, and cholesterol may control cholesterol levels. °¨ Controlling carbohydrates and sugar intake is recommended to manage diabetes. °¨ Reducing calorie intake and making food choices that are low in sodium, saturated fat, trans fat, and cholesterol are recommended to manage obesity. °· Maintain a healthy weight. °· Stay physically active. It is recommended that you get at least 30 minutes of  activity on all or most days. °· Do not use any tobacco products including cigarettes, chewing tobacco, or electronic cigarettes. °· Limit alcohol use even if you are not taking warfarin. Moderate alcohol use is considered to be: °¨ No more than 2 drinks each day for men. °¨ No more than 1 drink each day for nonpregnant women. °· Home safety. A safe home environment is important to reduce the risk of falls. Your health care provider may arrange for specialists to evaluate your home. Having grab bars in the bedroom and bathroom is often important. Your health care provider may arrange for equipment to be used at home, such as raised toilets and a seat for the shower. °· Physical, occupational, and speech therapy. Ongoing therapy may be needed to maximize your recovery after a stroke. If you have been advised to use a walker or a cane, use it at all times. Be sure to keep your therapy appointments. °· Follow all instructions for follow-up with your health care provider. This is very important. This includes any referrals, physical therapy, rehabilitation, and lab tests. Proper follow-up can prevent another stroke from occurring. °SEEK MEDICAL CARE IF: °· You have personality changes. °· You have difficulty swallowing. °· You are seeing double. °· You have dizziness. °· You have a fever. °· You have skin breakdown. °SEEK IMMEDIATE MEDICAL CARE IF:  °Any of these symptoms may represent a serious problem that is an emergency. Do not wait to see if the symptoms will go away. Get medical help right away. Call your local emergency services (911 in [place] Do not drive yourself to the hospital. °· You have sudden weakness or numbness of the face, arm, or leg, especially on one side of the body. °· You have sudden trouble walking or difficulty moving arms or legs. °· You have sudden confusion. °· You have trouble speaking (aphasia) or understanding. °· You have sudden trouble seeing in one or both eyes. °· You have a loss of  balance or coordination. °· You have a sudden, severe headache with no known cause. °· You have new chest pain or an irregular heartbeat. °· You have a partial or total loss of consciousness. °Document Released: 03/23/2005 Document Revised: 08/07/2013 Document Reviewed: 11/01/2011 °[employer]® Patient Information ©2015 [employer]. This information is not intended to replace advice given to you by your health care provider. Make sure you discuss any questions you have with your health care provider. ° °

## 2014-03-07 NOTE — Clinical Social Work Placement (Signed)
Clinical Social Work Department CLINICAL SOCIAL WORK PLACEMENT NOTE 03/07/2014  Patient:  Charles Joyce, Charles Joyce  Account Number:  [id number] Admit date:  03/05/2014  Clinical Social Worker:  Greta Doom, LCSWA  Date/time:  03/07/2014 01:34 PM  Clinical Social Work is seeking post-discharge placement for this patient at the following level of care:   SKILLED NURSING   (*CSW will update this form in Epic as items are completed)   03/07/2014  Patient/family provided with [facility] Department of Clinical Social Work's list of facilities offering this level of care within the geographic area requested by the patient (or if unable, by the patient's family).  03/07/2014  Patient/family informed of their freedom to choose among providers that offer the needed level of care, that participate in Medicare, Medicaid or managed care program needed by the patient, have an available bed and are willing to accept the patient.  03/07/2014  Patient/family informed of [facility]' ownership interest in [facility], as well as of the fact that they are under no obligation to receive care at this facility.  PASARR submitted to [facility] on 03/07/2014 PASARR number received on 03/07/2014  FL2 transmitted to all facilities in geographic area requested by pt/family on  03/07/2014 FL2 transmitted to all facilities within larger geographic area on 03/07/2014  Patient informed that his/her managed care company has contracts with or will negotiate with  certain facilities, including the following:     Patient/family informed of bed offers received:  03/07/2014 Patient chooses bed at [facility] Physician recommends and patient chooses bed at    Patient to be transferred to  on   Patient to be transferred to facility by  Patient and family notified of transfer on  Name of family member notified:    The following physician request were entered in Epic:   Additional Comments:  [place],  MSW, [place]

## 2014-03-07 NOTE — Progress Notes (Signed)
STROKE TEAM PROGRESS NOTE   HISTORY Charles Joyce is an 73 y.o. male with history of posterior left temporal lobe AVM which has been treated at [facility] by neuro oncology in 2013. Patient is followed closely by [facility] for his AVM. He states he has residual right field cut from AVM but no other symptoms. Today 03/05/2012 he was on the computer at 1200 when he noted right face, arm and leg decreased sensation clumsiness of the right hand and a sensation of weakness in the right arm and leg. His leg weakness and numbness has improved but he remains to have decreased sensation in his left arm and face. He states he takes ASA but not on a daily basis and he is on no other antiplatelet. Patient was not administered TPA secondary to history of both ICH and AVM along with minimal symptoms. He was admitted for further evaluation and treatment.   SUBJECTIVE (INTERVAL HISTORY) Patient feels he is still unsteady on his feet. He lives at [facility] - would like therapy there if needed. MRI scan of the brain confirms left thalamic subcortical infarct.  OBJECTIVE Temp:  [97.5 F (36.4 C)-98.3 F (36.8 C)] 98.2 F (36.8 C) (12/02 1000) Pulse Rate:  [63-85] 85 (12/02 1000) Cardiac Rhythm:  [-] Normal sinus rhythm (12/01 2100) Resp:  [16-18] 18 (12/02 1000) BP: (107-144)/(57-83) 123/76 mmHg (12/02 1000) SpO2:  [95 %-100 %] 100 % (12/02 1000)   Recent Labs Lab 03/05/14 1355 03/05/14 2105 03/06/14 0709 03/06/14 1136  GLUCAP 98 121* 95 109*    Recent Labs Lab 03/05/14 1413  NA 140  K 5.0  CL 102  CO2 28  GLUCOSE 93  BUN 23  CREATININE 0.98  CALCIUM 9.3   No results for input(s): AST, ALT, ALKPHOS, BILITOT, PROT, ALBUMIN in the last 168 hours.  Recent Labs Lab 03/05/14 1413  WBC 6.4  NEUTROABS 3.6  HGB 16.7  HCT 47.9  MCV 95.4  PLT 168   No results for input(s): CKTOTAL, CKMB, CKMBINDEX, TROPONINI in the last 168 hours. No results for input(s): LABPROT, INR in the last 72  hours.  Recent Labs  03/05/14 1533  COLORURINE YELLOW  LABSPEC 1.014  PHURINE 6.0  GLUCOSEU NEGATIVE  HGBUR NEGATIVE  BILIRUBINUR NEGATIVE  KETONESUR NEGATIVE  PROTEINUR NEGATIVE  UROBILINOGEN 0.2  NITRITE NEGATIVE  LEUKOCYTESUR NEGATIVE       Component Value Date/Time   CHOL 180 03/06/2014 0446   TRIG 154* 03/06/2014 0446   HDL 46 03/06/2014 0446   CHOLHDL 3.9 03/06/2014 0446   VLDL 31 03/06/2014 0446   LDLCALC 103* 03/06/2014 0446   Lab Results  Component Value Date   HGBA1C 5.5 03/05/2014      Component Value Date/Time   LABOPIA NONE DETECTED 03/06/2014 0557   COCAINSCRNUR NONE DETECTED 03/06/2014 0557   LABBENZ NONE DETECTED 03/06/2014 0557   AMPHETMU NONE DETECTED 03/06/2014 0557   THCU NONE DETECTED 03/06/2014 0557   LABBARB NONE DETECTED 03/06/2014 0557     Recent Labs Lab 03/05/14 1413  ETH <11    Ct Angio Head W/cm &/or Wo Cm  03/05/2014   CLINICAL DATA:  Episode of right-sided weakness. The symptoms have since resolved. Personal history of the AVM treated with coils.  EXAM: CT ANGIOGRAPHY HEAD AND NECK  TECHNIQUE: Multidetector CT imaging of the head and neck was performed using the standard protocol during bolus administration of intravenous contrast. Multiplanar CT image reconstructions and MIPs were obtained to evaluate the vascular anatomy. Carotid stenosis  measurements (when applicable) are obtained utilizing NASCET criteria, using the distal internal carotid diameter as the denominator.  CONTRAST:  83mL OMNIPAQUE IOHEXOL 350 MG/ML SOLN  COMPARISON:  CT head without contrast 03/05/2014.  FINDINGS: CTA HEAD FINDINGS  The source images demonstrate no evidence for acute or subacute infarction. Liquid embolic agent is noted within a left occipital lobe AVM. There are no residual enlarged feeding or draining vessels.  The internal carotid arteries are within normal limits to the ICA termini bilaterally. The A1 and M1 segments are normal. No definite anterior  communicating artery is present.  The MCA bifurcations are intact. There is attenuation of anterior MCA branch vessels bilaterally, left greater than right. The ACA branch vessels are intact.  The left vertebral artery is the dominant vessel. Both posterior cerebral arteries originate from the basilar tip. The basilar artery is normal. There is a moderate stenosis of the proximal left posterior cerebral artery with significant attenuation of distal branch vessels.  The dural sinuses are patent bilaterally. Right transverse sinus is dominant.  Review of the MIP images confirms the above findings.  CTA NECK FINDINGS  A 3 vessel arch configuration is present. Made vertebral arteries originate from the subclavian arteries. There dense calcifications at the origin of the right vertebral artery. The left vertebral artery is the dominant vessel. There is mild narrowing of the the more distal right vertebral artery at the C2 level. Atherosclerotic calcifications are present at dural margin of the dominant left vertebral artery without other stenoses in the neck.  The right common carotid artery is within normal limits. The bifurcation is within normal limits. There is marked tortuosity of the cervical right internal carotid artery without focal stenosis.  The left common carotid artery is within normal limits. Noncalcified plaque is present at the left carotid bifurcation. The proximal internal carotid artery is narrowed to 2.0 mm, less than 50% stenosis relative to the more distal vessel. There is marked tortuosity of the midcervical left ICA without a significant stenosis.  Soft tissues the neck are otherwise unremarkable. The bone windows demonstrate mild endplate degenerative changes at C5-6 and C6-7.  Review of the MIP images confirms the above findings.  IMPRESSION: 1. Status post embolization of left occipital lobe AVM without evidence for recurrence. 2. Moderate small vessel attenuation of anterior MCA branch  vessels, left greater than right. No significant focal proximal stenosis or occlusion is evident. 3. 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 4. 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 The left vertebral artery is dominant. 5. Less than 50% narrowing of the proximal left internal carotid artery. 6. Marked tortuosity of the cervical internal carotid arteries bilaterally without focal stenosis.   Electronically Signed   By: Lawrence Santiago M.D.   On: 03/05/2014 17:45   Ct Head Wo Contrast  03/05/2014   CLINICAL DATA:  Right arm leg and facial numbness. History of arteriovenous malformation.  EXAM: CT HEAD WITHOUT CONTRAST  TECHNIQUE: Contiguous axial images were obtained from the base of the skull through the vertex without intravenous contrast.  COMPARISON:  None.  FINDINGS: Coils are identified in the left occipital lobe for treatment of an arteriovenous malformation. No evidence of acute intracranial abnormality including hemorrhage, infarct, mass lesion, mass effect, midline shift or abnormal extra-axial fluid collection is identified. There is no hydrocephalus or pneumocephalus the calvarium is intact. Imaged paranasal sinuses and mastoid air cells are clear.  IMPRESSION: No acute finding.  Coils in the left occipital  lobe for treatment of an arteriovenous malformation.   Electronically Signed   By: Inge Rise M.D.   On: 03/05/2014 14:47   Ct Angio Neck W/cm &/or Wo/cm  03/05/2014   CLINICAL DATA:  Episode of right-sided weakness. The symptoms have since resolved. Personal history of the AVM treated with coils.  EXAM: CT ANGIOGRAPHY HEAD AND NECK  TECHNIQUE: Multidetector CT imaging of the head and neck was performed using the standard protocol during bolus administration of intravenous contrast. Multiplanar CT image reconstructions and MIPs were obtained to evaluate the vascular anatomy. Carotid stenosis measurements (when applicable) are obtained  utilizing NASCET criteria, using the distal internal carotid diameter as the denominator.  CONTRAST:  32mL OMNIPAQUE IOHEXOL 350 MG/ML SOLN  COMPARISON:  CT head without contrast 03/05/2014.  FINDINGS: CTA HEAD FINDINGS  The source images demonstrate no evidence for acute or subacute infarction. Liquid embolic agent is noted within a left occipital lobe AVM. There are no residual enlarged feeding or draining vessels.  The internal carotid arteries are within normal limits to the ICA termini bilaterally. The A1 and M1 segments are normal. No definite anterior communicating artery is present.  The MCA bifurcations are intact. There is attenuation of anterior MCA branch vessels bilaterally, left greater than right. The ACA branch vessels are intact.  The left vertebral artery is the dominant vessel. Both posterior cerebral arteries originate from the basilar tip. The basilar artery is normal. There is a moderate stenosis of the proximal left posterior cerebral artery with significant attenuation of distal branch vessels.  The dural sinuses are patent bilaterally. Right transverse sinus is dominant.  Review of the MIP images confirms the above findings.  CTA NECK FINDINGS  A 3 vessel arch configuration is present. Made vertebral arteries originate from the subclavian arteries. There dense calcifications at the origin of the right vertebral artery. The left vertebral artery is the dominant vessel. There is mild narrowing of the the more distal right vertebral artery at the C2 level. Atherosclerotic calcifications are present at dural margin of the dominant left vertebral artery without other stenoses in the neck.  The right common carotid artery is within normal limits. The bifurcation is within normal limits. There is marked tortuosity of the cervical right internal carotid artery without focal stenosis.  The left common carotid artery is within normal limits. Noncalcified plaque is present at the left carotid  bifurcation. The proximal internal carotid artery is narrowed to 2.0 mm, less than 50% stenosis relative to the more distal vessel. There is marked tortuosity of the midcervical left ICA without a significant stenosis.  Soft tissues the neck are otherwise unremarkable. The bone windows demonstrate mild endplate degenerative changes at C5-6 and C6-7.  Review of the MIP images confirms the above findings.  IMPRESSION: 1. Status post embolization of left occipital lobe AVM without evidence for recurrence. 2. Moderate small vessel attenuation of anterior MCA branch vessels, left greater than right. No significant focal proximal stenosis or occlusion is evident. 3. 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 4. 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 The left vertebral artery is dominant. 5. Less than 50% narrowing of the proximal left internal carotid artery. 6. Marked tortuosity of the cervical internal carotid arteries bilaterally without focal stenosis.   Electronically Signed   By: Lawrence Santiago M.D.   On: 03/05/2014 17:45   Mr Brain Wo Contrast  03/06/2014   CLINICAL DATA:  73 year old male with recent episode of right side  weakness. Initial encounter. Personal history of intracranial AVM treated with Onyx.  EXAM: MRI HEAD WITHOUT CONTRAST  TECHNIQUE: Multiplanar, multiecho pulse sequences of the brain and surrounding structures were obtained without intravenous contrast.  COMPARISON:  Head and neck CTA 03/05/2014.  FINDINGS: 9 mm focus of restricted diffusion in the central left thalamus, in proximity to the posterior limb left internal capsule (series 3, image 24). Associated T2 and FLAIR hyperintensity. No mass effect or associated hemorrhage.  Major intracranial vascular flow voids are preserved, although there is increased FLAIR signal in the left PCA (series 7, image 92).  No other restricted diffusion. 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 Associated T2* signal loss related to the liquid embolic agent. Left occipital encephalomalacia and gliosis.  Elsewhere gray and white matter signal is within normal limits for age. No ventriculomegaly. No midline shift, mass effect, or evidence of intracranial mass lesion. No acute intracranial hemorrhage identified. Negative pituitary, cervicomedullary junction and visualized cervical spine.  Normal visualized internal auditory structures except for abnormal enlargement of the left horizontal semicircular canal and left vestibule. Mastoids are clear. Mild paranasal sinus mucosal thickening. Postoperative changes to the left globe. Visualized scalp soft tissues are within normal limits. Normal bone marrow signal.  IMPRESSION: 1. Acute lacunar infarct of the left thalamus. No associated mass effect or hemorrhage. 2. Signal abnormality of the left PCA, corresponding to the segment demonstrating up to severe stenosis on the recent CTA. 3. Sequelae of left occipital lobe AVM treatment.   Electronically Signed   By: Lars Pinks M.D.   On: 03/06/2014 19:55   Carotid Doppler  There is 1-39% bilateral ICA stenosis. Vertebral artery flow is antegrade.    2D Echocardiogram  EF 60-65% with no source of embolus.    PHYSICAL EXAM Elderly Caucasian male not in distress.Awake alert. Afebrile. Head is nontraumatic. Neck is supple without bruit. Hearing is normal. Cardiac exam no murmur or gallop. Lungs are clear to auscultation. Distal pulses are well felt. Neurological Exam ;  Awake  Alert oriented x 3. Normal speech and language.eye movements full without nystagmus.fundi were not visualized. Vision acuity and fields appear normal. Hearing is normal. Palatal movements are normal. Face symmetric. Tongue midline. Normal strength except mild right hip flexor weakness., tone, reflexes and coordination. Diminished right hemibody sensation. Gait  deferred. ASSESSMENT/PLAN [name] is a 73 y.o. male with history of posterior left temporal lobe AVM presenting with right sided numbness and weakness. He did not receive IV t-PA due to history of ICH and AVM along with minimal symptoms.   Stroke:  Dominant small left thalamic infarct,    secondary to small vessel disease source (stroke not felt to be related to AVM)  Resultant  Mild right hemiparesis  MRI  ordered  CT angio head and neck embolization of left occipital AVM without recurrence, small vessel disease, moderate to severe stenosis proximal left posterior cerebral artery, high-grade stenosis proximal right vertebral artery  Carotid Doppler  No significant stenosis   2D Echo  No source of embolus  HgbA1c 5.5  Heparin 5000 units sq tid for VTE prophylaxis  Diet Heart thin liquids  no antithrombotics prior to admission, now on aspirin 325 mg orally every day  Patient counseled to be compliant with his antithrombotic medications  Ongoing aggressive stroke risk factor management  Therapy recommendations:  pending   Disposition:  Anticipate return to [facility], even if therpay needed  NO FURTHER STROKE WORKUP INDICATED Patient has a 10-15% risk of having 2another stroke over the next year, the highest risk is within 2 weeks of the most recent stroke/TIA (risk of having a stroke following a stroke or TIA is the same). Ongoing risk factor control by Primary Care Physician Stroke Service will sign off. Please call should any needs arise. Follow up with Dr. Antony Contras at [facility] in 1 month(s), order placed.   Hypertension  Stable  Hyperlipidemia  Home meds:  zocor 10, resumed in hospital  LDL 103, goal < 70  Continue statin at discharge  Other Stroke Risk Factors4  Advanced age  Obesity, Body mass index is 30 kg/(m^2).   Hx TIA  Other Active Problems  Left temporal lobe AVM, Treated at [facility] right upper quadrant  due to treatment AVM  Hospital day # [place] [facility] for Pager information 03/07/2014 10:20 AM  I have personally examined this patient, reviewed notes, independently viewed imaging studies, participated in medical decision making and plan of care. I have made any additions or clarifications directly to the above note. Agree with note above.. Stroke team will sign off. Currently call for questions. Follow-up as an outpatient in 4 weeks in stroke clinic  Antony Contras, [place] Pager: [phone] 03/07/2014 12:02 PM    To contact Stroke Continuity provider, please refer to [email or web address] After hours, contact General Neurology

## 2014-03-07 NOTE — Evaluation (Signed)
Physical Therapy Evaluation Patient Details Name: Charles Joyce MRN: [id number] DOB: 04/29/1940 Today's Date: 03/07/2014   History of Present Illness  73 yo male admitted via EMS from home with R side face arm and leg numbness. MRI (+) Acute lacunar infarct of the left thalamus PMH: TIA 2010, L temporal lobe AVM in brain 2013 has it cauterized 2013, R visual field cut from AVM, staph infection spinal cord, Blind upper R quadrant, Bil cataracts, HTN, OSA, HLD  Clinical Impression  Pt admitted with Lt CVA. Pt currently with functional limitations due to the deficits listed below (see PT Problem List). 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 Pt currently scored 36/56 on Berg Balance Test, indicative of high fall risk. Combined with his slightly impulsive personality, feel pt is indeed a high fall risk. Pt will benefit from skilled PT to increase their independence and safety with mobility to allow discharge to the venue listed below.       Follow Up Recommendations SNF;Supervision for mobility/OOB    Equipment Recommendations  None recommended by PT    Recommendations for Other Services OT consult     Precautions / Restrictions Precautions Precautions: Fall Restrictions Weight Bearing Restrictions: No      Mobility  Bed Mobility                  Transfers Overall transfer level: Needs assistance Equipment used: None Transfers: Sit to/from Stand Sit to Stand: Supervision         General transfer comment: due to decr balance; for safety  Ambulation/Gait Ambulation/Gait assistance: Min guard;Min assist Ambulation Distance (Feet): 250 Feet Assistive device: None Gait Pattern/deviations: Step-through pattern;Staggering left;Drifts right/left;Wide base of support Gait velocity: decr, although able to incr   General Gait Details: staggering loss of balance to his Lt with head turns  Stairs             Wheelchair Mobility    Modified Rankin (Stroke Patients Only) Modified Rankin (Stroke Patients Only) Pre-Morbid Rankin Score: No symptoms Modified Rankin: Moderately severe disability     Balance Overall balance assessment: Needs assistance                 Single Leg Stance - Left Leg: 0 (falls to Rt; on second attempt 10 very unsteady) Tandem Stance - Right Leg: 10   Rhomberg - Eyes Opened: 30 (supervision) Rhomberg - Eyes Closed: 0 (10 with minguard due to excessive sway)     Standardized Balance Assessment Standardized Balance Assessment : Berg Balance Test;Dynamic Gait Index Berg Balance Test Sit to Stand: Able to stand without using hands and stabilize independently Standing Unsupported: Able to stand 2 minutes with supervision Sitting with Back Unsupported but Feet Supported on Floor or Stool: Able to sit safely and securely 2 minutes Stand to Sit: Sits safely with minimal use of hands Transfers: Able to transfer with verbal cueing and /or supervision Standing Unsupported with Eyes Closed: Able to stand 10 seconds with supervision Standing Ubsupported with Feet Together: Able to place feet together independently and stand for 1 minute with supervision From Standing, Reach Forward with Outstretched Arm: Reaches forward but needs supervision From Standing Position, Pick up Object from Floor: Able to pick up shoe, needs supervision From Standing Position, Turn to Look Behind Over each Shoulder: Looks behind from both sides and weight shifts well Turn 360 Degrees: Needs close supervision or verbal cueing Standing Unsupported, Alternately Place Feet on Step/Stool:  Able to complete >2 steps/needs minimal assist Standing Unsupported, One Foot in Front: Able to take small step independently and hold 30 seconds Standing on One Leg: Tries to lift leg/unable to hold 3 seconds but remains standing independently Total Score: 36 Dynamic Gait Index Level Surface:  Moderate Impairment Change in Gait Speed: Mild Impairment Gait with Horizontal Head Turns: Severe Impairment Gait with Vertical Head Turns: Mild Impairment Gait and Pivot Turn: Mild Impairment       Pertinent Vitals/Pain Pain Assessment: No/denies pain    Home Living Family/patient expects to be discharged to:: Skilled nursing facility                 Additional Comments: lives in [place] [facility]; can go to SNF there    Prior Function Level of Independence: Independent               Hand Dominance   Dominant Hand: Right    Extremity/Trunk Assessment   Upper Extremity Assessment: Defer to OT evaluation           Lower Extremity Assessment: RLE deficits/detail RLE Deficits / Details: AROM WFL; strength WFL    Cervical / Trunk Assessment: Other exceptions  Communication   Communication: No difficulties  Cognition Arousal/Alertness: Awake/alert Behavior During Therapy: WFL for tasks assessed/performed Overall Cognitive Status: No family/caregiver present to determine baseline cognitive functioning (slightly impulsive; may be his baseline)                      General Comments General comments (skin integrity, edema, etc.): Pt reports his wife has neurological condition similar to MS with times her balance is "off" and is unable to physically assist him.     Exercises        Assessment/Plan    PT Assessment Patient needs continued PT services  PT Diagnosis Difficulty walking;Hemiplegia dominant side   PT Problem List Decreased balance;Decreased mobility;Decreased knowledge of use of DME;Decreased safety awareness;Impaired sensation  PT Treatment Interventions DME instruction;Gait training;Stair training;Functional mobility training;Therapeutic activities;Balance training;Neuromuscular re-education;Cognitive remediation;Patient/family education   PT Goals (Current goals can be found in the Care Plan section) Acute Rehab PT  Goals Patient Stated Goal: to be independent when he returns home PT Goal Formulation: With patient Time For Goal Achievement: 03/14/14 Potential to Achieve Goals: Good    Frequency Min 3X/week   Barriers to discharge Decreased caregiver support      Co-evaluation               End of Session Equipment Utilized During Treatment: Gait belt Activity Tolerance: Patient tolerated treatment well Patient left: in chair;with call bell/phone within reach;with chair alarm set;with family/visitor present Nurse Communication: Mobility status         Time: 5188-4166 PT Time Calculation (min) (ACUTE ONLY): 20 min   Charges:   PT Evaluation $Initial PT Evaluation Tier I: 1 Procedure     PT G Codes:          Charles Joyce 04/04/2014, 11:08 AM Pager [phone]

## 2014-03-09 DIAGNOSIS — I10 Essential (primary) hypertension: Secondary | ICD-10-CM

## 2014-03-09 DIAGNOSIS — I480 Paroxysmal atrial fibrillation: Secondary | ICD-10-CM

## 2014-03-09 DIAGNOSIS — I639 Cerebral infarction, unspecified: Secondary | ICD-10-CM

## 2014-03-09 DIAGNOSIS — E785 Hyperlipidemia, unspecified: Secondary | ICD-10-CM

## 2014-05-08 ENCOUNTER — Encounter: Payer: Self-pay | Admitting: Internal Medicine

## 2014-05-08 ENCOUNTER — Telehealth: Payer: Self-pay | Admitting: *Deleted

## 2014-05-08 ENCOUNTER — Ambulatory Visit (INDEPENDENT_AMBULATORY_CARE_PROVIDER_SITE_OTHER): Payer: Medicare HMO | Admitting: Internal Medicine

## 2014-05-08 VITALS — BP 128/70 | HR 60 | Ht 65.0 in | Wt 175.2 lb

## 2014-05-08 DIAGNOSIS — I4891 Unspecified atrial fibrillation: Secondary | ICD-10-CM

## 2014-05-08 NOTE — Patient Instructions (Signed)
Your physician recommends that you continue on your current medications as directed. Please refer to the Current Medication list given to you today.  No follow up at this time   Please call me by next Friday of you have not heard from Dr. Caryl Comes regarding Dr. Leonie Man [phone] Elmyra Ricks

## 2014-05-08 NOTE — Telephone Encounter (Signed)
Informed patient per Dr. Caryl Comes:  Per conversation with Dr. Leonie Man  Patients stroke is not related to afib  No further follow up at this time   Patient verbalized understanding

## 2014-05-08 NOTE — Progress Notes (Signed)
ELECTROPHYSIOLOGY CONSULT NOTE  Patient ID: Charles Joyce, MRN: [id number], DOB/AGE: Feb 19, 1941 74 y.o. Admit date: (Not on file) Date of Consult: 05/08/2014  Primary Physician: Kirk Ruths., MD Primary Cardiologist: new  Chief Complaint: stroke    HPI Charles Joyce is a 74 y.o. male  referred for consideration of loop recorder implantation.  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  He been followed by [facility] neuro-oncology.   He was admitted to [place] 11/15 with symptoms of right sided numbness. It was felt that this will remain infarct, not seen on CT, with subcortical secondary to small vessel disease and specifically not related to his AVM. MRI scan demonstrated acute lacunar infarct.  He has a history of atrial fibrillation that occurred in 2013 while hospitalized at [facility] in the context of a apparently quite severe systemic illness.  He has had no other atrial fibrillation diagnosed  Transthoracic echocardiogram demonstrated ejection fraction of 60-65%. Normal left atrial dimensions.  The patient denies chest pain, shortness of breath, nocturnal dyspnea, orthopnea or peripheral edema.  There have been no palpitations, lightheadedness or syncope.      Past Medical History  Diagnosis Date  . TIA (transient ischemic attack)   . AVM (arteriovenous malformation) brain   . 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 History of prostatectomy   . Cataracts, bilateral   . 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 Stroke   . A-fib   . Hypertension   . Hyperlipidemia   . Hearing loss   . Visual loss       Surgical History:  Past Surgical History  Procedure Laterality Date  . Prostatectomy    . Brain avm repair       Home Meds: Prior to Admission medications   Medication Sig Start Date End Date Taking? Authorizing Provider  acetaminophen (TYLENOL ARTHRITIS  PAIN) 650 MG CR tablet Take 650 mg by mouth every 8 (eight) hours as needed for pain.   Yes Historical Provider, MD  aspirin 325 MG tablet Take 1 tablet (325 mg total) by mouth daily. 03/07/14  Yes Bonnielee Haff, MD  metoprolol (LOPRESSOR) 100 MG tablet Take 100 mg by mouth daily.   Yes Historical Provider, MD  Multiple Vitamins-Minerals (ICAPS AREDS FORMULA PO) Take 2 capsules by mouth daily.    Yes Historical Provider, MD  simvastatin (ZOCOR) 20 MG tablet Take 1 tablet (20 mg total) by mouth daily. 03/07/14  Yes Bonnielee Haff, MD         Allergies: No Known Allergies  History   Social History  . Marital Status: Married    Spouse Name: N/A    Number of Children: N/A  . Years of Education: N/A   Occupational History  . Not on file.   Social History Main Topics  . Smoking status: Never Smoker   . Smokeless tobacco: Not on file  . Alcohol Use: Yes  . Drug Use: No  . Sexual Activity: Not on file   Other Topics Concern  . Not on file   Social History Narrative     Family History  Problem Relation Age of Onset  . Diabetes      Mother  . Alzheimer's disease      Mother     ROS:  Please see the history of present illness.     All other  systems reviewed and negative.    Physical Exam   Blood pressure 128/70, pulse 60, height 5\' 5"  (1.651 m), weight 175 lb 4 oz (79.493 kg). General: Well developed, well nourished male in no acute distress. Head: Normocephalic, atraumatic, sclera non-icteric, no xanthomas, nares are without discharge. EENT: normal Lymph Nodes:  none Back: without scoliosis/kyphosis , no CVA tendersness Neck: Negative for carotid bruits. JVD not elevated. Lungs: Clear bilaterally to auscultation without wheezes, rales, or rhonchi. Breathing is unlabored. Heart: RRR with S1 S2. No murmur , rubs, or gallops appreciated. Abdomen: Soft, non-tender, non-distended with normoactive bowel sounds. No hepatomegaly. No rebound/guarding. No obvious abdominal  masses. Msk:  Strength and tone appear normal for age. Extremities: No clubbing or cyanosis. No*  edema.  Distal pedal pulses are 2+ and equal bilaterally. Skin: Warm and Dry Neuro: Alert and oriented X 3. CN III-XII intact Grossly normal sensory and motor function . Psych:  Responds to questions appropriately with a normal affect.      Labs: Cardiac Enzymes No results for input(s): CKTOTAL, CKMB, TROPONINI in the last 72 hours. CBC Lab Results  Component Value Date   WBC 7.4 03/07/2014   HGB 17.5* 03/07/2014   HCT 50.0 03/07/2014   MCV 94.9 03/07/2014   PLT 162 03/07/2014   PROTIME: No results for input(s): LABPROT, INR in the last 72 hours. Chemistry No results for input(s): NA, K, CL, CO2, BUN, CREATININE, CALCIUM, PROT, BILITOT, ALKPHOS, ALT, AST, GLUCOSE in the last 168 hours.  Invalid input(s): LABALBU Lipids Lab Results  Component Value Date   CHOL 180 03/06/2014   HDL 46 03/06/2014   LDLCALC 103* 03/06/2014   TRIG 154* 03/06/2014   BNP No results found for: PROBNP Miscellaneous No results found for: DDIMER  Radiology/Studies:  No results found.  EKG sinus with narrow QRS   Assessment and Plan:  TIA/stroke  AVM-coiled with history of intracranial hemorrhage  Atrial fibrillation the context of systemic illness  Hypertension  The patient had a stroke 11/15. The question is whether he has atrial fibrillation which might have been treated to his stroke based on history from [place]. I think it is reasonable to ascribe an episode of atrial fibrillation, based on his story, today severe concomitant illness.  2 issues are raised by the story and the potential role of atrial fibrillation. The first is that with his history of intracranial hemorrhage, he would not be a candidate for anti coagulation. However, the [facility] DEVICE is now locally available at [facility] and would be a reasonable consideration if atrial fibrillation were in fact detected. The second, however,  is whether the lacunar infarct described on MRI scanning is mechanistically associated with atrial fibrillation.  I don't know the answer to this question I will discuss this with Dr. Leonie Man. In the event that the answer is yes loop recording I think is reasonable; if the answer is not and I think it should be deferred    [facility] with Dr Leonie Man and he feels taht  lacunar infarct is not associated with atrial fibrillation and thus no workup for cryptogenic stroke is indicated

## 2014-05-10 ENCOUNTER — Ambulatory Visit: Payer: Self-pay | Admitting: Neurology

## 2014-05-10 NOTE — Progress Notes (Signed)
I agree with the above plan 

## 2014-05-21 ENCOUNTER — Ambulatory Visit: Payer: Self-pay | Admitting: Neurology

## 2014-08-08 ENCOUNTER — Ambulatory Visit: Payer: Medicare Other | Admitting: Internal Medicine

## 2015-02-19 ENCOUNTER — Encounter: Payer: Self-pay | Admitting: *Deleted

## 2015-02-20 ENCOUNTER — Ambulatory Visit
Admission: RE | Admit: 2015-02-20 | Discharge: 2015-02-20 | Disposition: A | Discharge status: Home / Self Care | Payer: Medicare HMO | Source: Ambulatory Visit | Attending: Unknown Physician Specialty | Admitting: Unknown Physician Specialty

## 2015-02-20 ENCOUNTER — Encounter: Payer: Self-pay | Admitting: *Deleted

## 2015-02-20 ENCOUNTER — Ambulatory Visit: Payer: Medicare HMO | Admitting: Anesthesiology

## 2015-02-20 ENCOUNTER — Encounter
Admission: RE | Disposition: A | Discharge status: Home / Self Care | Payer: Self-pay | Source: Ambulatory Visit | Attending: Unknown Physician Specialty

## 2015-02-20 DIAGNOSIS — G4730 Sleep apnea, unspecified: Secondary | ICD-10-CM | POA: Diagnosis not present

## 2015-02-20 DIAGNOSIS — I69398 Other sequelae of cerebral infarction: Secondary | ICD-10-CM | POA: Diagnosis not present

## 2015-02-20 DIAGNOSIS — D120 Benign neoplasm of cecum: Secondary | ICD-10-CM | POA: Insufficient documentation

## 2015-02-20 DIAGNOSIS — H5441 Blindness, right eye, normal vision left eye: Secondary | ICD-10-CM | POA: Insufficient documentation

## 2015-02-20 DIAGNOSIS — M1990 Unspecified osteoarthritis, unspecified site: Secondary | ICD-10-CM | POA: Insufficient documentation

## 2015-02-20 DIAGNOSIS — R200 Anesthesia of skin: Secondary | ICD-10-CM | POA: Insufficient documentation

## 2015-02-20 DIAGNOSIS — N529 Male erectile dysfunction, unspecified: Secondary | ICD-10-CM | POA: Diagnosis not present

## 2015-02-20 DIAGNOSIS — I739 Peripheral vascular disease, unspecified: Secondary | ICD-10-CM | POA: Diagnosis not present

## 2015-02-20 DIAGNOSIS — Z7982 Long term (current) use of aspirin: Secondary | ICD-10-CM | POA: Insufficient documentation

## 2015-02-20 DIAGNOSIS — D122 Benign neoplasm of ascending colon: Secondary | ICD-10-CM | POA: Insufficient documentation

## 2015-02-20 DIAGNOSIS — E785 Hyperlipidemia, unspecified: Secondary | ICD-10-CM | POA: Diagnosis not present

## 2015-02-20 DIAGNOSIS — K640 First degree hemorrhoids: Secondary | ICD-10-CM | POA: Diagnosis not present

## 2015-02-20 DIAGNOSIS — I10 Essential (primary) hypertension: Secondary | ICD-10-CM | POA: Diagnosis not present

## 2015-02-20 DIAGNOSIS — Z86010 Personal history of colonic polyps: Secondary | ICD-10-CM | POA: Diagnosis present

## 2015-02-20 DIAGNOSIS — Z79899 Other long term (current) drug therapy: Secondary | ICD-10-CM | POA: Diagnosis not present

## 2015-02-20 HISTORY — PX: COLONOSCOPY WITH PROPOFOL: SHX5780

## 2015-02-20 SURGERY — COLONOSCOPY WITH PROPOFOL
Anesthesia: General

## 2015-02-20 MED ORDER — PROPOFOL 10 MG/ML IV BOLUS
INTRAVENOUS | Status: DC | PRN
  Administered 2015-02-20: 80 mg via INTRAVENOUS

## 2015-02-20 MED ORDER — SODIUM CHLORIDE 0.9 % IV SOLN
INTRAVENOUS | Status: DC
  Administered 2015-02-20: 15:00:00 via INTRAVENOUS
  Administered 2015-02-20: 1000 mL via INTRAVENOUS
  Administered 2015-02-20: 14:00:00 via INTRAVENOUS

## 2015-02-20 MED ORDER — SODIUM CHLORIDE 0.9 % IV SOLN: INTRAVENOUS | Status: DC

## 2015-02-20 MED ORDER — PROPOFOL 500 MG/50ML IV EMUL
INTRAVENOUS | Status: DC | PRN
  Administered 2015-02-20: 140 ug/kg/min via INTRAVENOUS

## 2015-02-20 NOTE — Anesthesia Preprocedure Evaluation (Addendum)
Anesthesia Evaluation  Patient identified by MRN, date of birth, ID band Patient awake    Reviewed: Allergy & Precautions, NPO status , Patient's Chart, lab work & pertinent test results, reviewed documented beta blocker date and time   Airway Mallampati: III  TM Distance: >3 FB Neck ROM: Full    Dental  (+) Chipped   Pulmonary sleep apnea ,    Pulmonary exam normal breath sounds clear to auscultation       Cardiovascular hypertension, Pt. on medications and Pt. on home beta blockers + Peripheral Vascular Disease  Normal cardiovascular exam     Neuro/Psych Visual loss in right upper section of right eye... Hx of AVM in head... Numbness and tingling of right arm and leg TIACVA, Residual Symptoms    GI/Hepatic negative GI ROS, Neg liver ROS,   Endo/Other  negative endocrine ROS  Renal/GU negative Renal ROS     Musculoskeletal negative musculoskeletal ROS (+)   Abdominal Normal abdominal exam  (+)   Peds  Hematology negative hematology ROS (+)   Anesthesia Other Findings Hx of AVM repair  Reproductive/Obstetrics                            Anesthesia Physical Anesthesia Plan  ASA: III  Anesthesia Plan: General   Post-op Pain Management:    Induction: Intravenous  Airway Management Planned: Nasal Cannula  Additional Equipment:   Intra-op Plan:   Post-operative Plan:   Informed Consent: I have reviewed the patients History and Physical, chart, labs and discussed the procedure including the risks, benefits and alternatives for the proposed anesthesia with the patient or authorized representative who has indicated his/her understanding and acceptance.   Dental advisory given  Plan Discussed with: CRNA and Surgeon  Anesthesia Plan Comments:         Anesthesia Quick Evaluation

## 2015-02-20 NOTE — Transfer of Care (Signed)
Immediate Anesthesia Transfer of Care Note  Patient: Charles Joyce  Procedure(s) Performed: Procedure(s): COLONOSCOPY WITH PROPOFOL (N/A)  Patient Location: PACU  Anesthesia Type:General  Level of Consciousness: sedated  Airway & Oxygen Therapy: Patient Spontanous Breathing and Patient connected to nasal cannula oxygen  Post-op Assessment: Report given to RN and Post -op Vital signs reviewed and stable  Post vital signs: stable  Last Vitals:  Filed Vitals:   02/20/15 1344  BP: 133/81  Pulse: 70  Temp: 37.2 C  Resp: 18    Complications: No apparent anesthesia complications

## 2015-02-20 NOTE — H&P (Signed)
Primary Care Physician:  Kirk Ruths., MD Primary Gastroenterologist:  Dr. Vira Agar  Pre-Procedure History & Physical: HPI:  Charles Joyce is a 74 y.o. male is here for an colonoscopy.   Past Medical History  Diagnosis Date  . TIA (transient ischemic attack)   . AVM (arteriovenous malformation) brain   . 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 History of prostatectomy   . Cataracts, bilateral   . 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 Stroke ([place])   . A-fib ([place])   . Hypertension   . Hyperlipidemia   . Hearing loss   . Visual loss     Past Surgical History  Procedure Laterality Date  . Prostatectomy    . Brain avm repair    . Tonsillectomy    . Cataract surgery      Prior to Admission medications   Medication Sig Start Date End Date Taking? Authorizing Provider  acetaminophen (TYLENOL ARTHRITIS PAIN) 650 MG CR tablet Take 650 mg by mouth every 8 (eight) hours as needed for pain.   Yes Historical Provider, MD  aspirin 325 MG tablet Take 1 tablet (325 mg total) by mouth daily. 03/07/14  Yes Bonnielee Haff, MD  cholecalciferol (VITAMIN D) 400 UNITS TABS tablet Take 1,000 Units by mouth.   Yes Historical Provider, MD  metoprolol (LOPRESSOR) 100 MG tablet Take 100 mg by mouth daily.   Yes Historical Provider, MD  Multiple Vitamins-Minerals (ICAPS AREDS FORMULA PO) Take 2 capsules by mouth daily.    Yes Historical Provider, MD  simvastatin (ZOCOR) 20 MG tablet Take 1 tablet (20 mg total) by mouth daily. 03/07/14  Yes Bonnielee Haff, MD  sildenafil (VIAGRA) 100 MG tablet Take 100 mg by mouth daily as needed for erectile dysfunction.    Historical Provider, MD    Allergies as of 01/28/2015  . (No Known Allergies)    Family History  Problem Relation Age of Onset  . Diabetes      Mother  . Alzheimer's disease      Mother    Social History   Social History  . Marital Status: Married    Spouse Name: N/A  .  Number of Children: N/A  . Years of Education: N/A   Occupational History  . Not on file.   Social History Main Topics  . Smoking status: Never Smoker   . Smokeless tobacco: Not on file  . Alcohol Use: Yes  . Drug Use: No  . Sexual Activity: Not on file   Other Topics Concern  . Not on file   Social History Narrative    Review of Systems: See HPI, otherwise negative ROS  Physical Exam: BP 133/81 mmHg  Pulse 70  Temp(Src) 99 F (37.2 C) (Oral)  Resp 18  Ht 5\' 5"  (1.651 m)  Wt 73.483 kg (162 lb)  BMI 26.96 kg/m2  SpO2 98% General:   Alert,  pleasant and cooperative in NAD Head:  Normocephalic and atraumatic. Neck:  Supple; no masses or thyromegaly. Lungs:  Clear throughout to auscultation.    Heart:  Regular rate and rhythm. Abdomen:  Soft, nontender and nondistended. Normal bowel sounds, without guarding, and without rebound.   Neurologic:  Alert and  oriented x4;  grossly normal neurologically.  Impression/Plan: Charles Joyce is here for an colonoscopy to be performed for [facility] colon polyps  Risks, benefits, limitations, and alternatives regarding  colonoscopy  have been reviewed with the patient.  Questions have been answered.  All parties agreeable.   Gaylyn Cheers, MD  02/20/2015, 2:11 PM

## 2015-02-20 NOTE — Anesthesia Postprocedure Evaluation (Signed)
  Anesthesia Post-op Note  Patient: Charles Joyce  Procedure(s) Performed: Procedure(s): COLONOSCOPY WITH PROPOFOL (N/A)  Anesthesia type:General  Patient location: PACU  Post pain: Pain level controlled  Post assessment: Post-op Vital signs reviewed, Patient's Cardiovascular Status Stable, Respiratory Function Stable, Patent Airway and No signs of Nausea or vomiting  Post vital signs: Reviewed and stable  Last Vitals:  Filed Vitals:   02/20/15 1525  BP: 129/69  Pulse: 60  Temp:   Resp: 17    Level of consciousness: awake, alert  and patient cooperative  Complications: No apparent anesthesia complications

## 2015-02-20 NOTE — Op Note (Signed)
[facility] Gastroenterology Patient Name: Charles Joyce Procedure Date: 02/20/2015 2:14 PM MRN: [id number] Account #: [id number] Date of Birth: 1940-05-29 Admit Type: Outpatient Age: 74 Room: [facility] ENDO ROOM 3 Gender: Male Note Status: Finalized Procedure:         Colonoscopy Indications:       High risk colon cancer surveillance: Personal history of                     colonic polyps Providers:         Manya Silvas, MD Referring MD:      Ocie Cornfield. Ouida Sills, MD (Referring MD) Medicines:         Propofol per Anesthesia Complications:     No immediate complications. Procedure:         Pre-Anesthesia Assessment:                    - After reviewing the risks and benefits, the patient was                     deemed in satisfactory condition to undergo the procedure.                    After obtaining informed consent, the colonoscope was                     passed under direct vision. Throughout the procedure, the                     patient's blood pressure, pulse, and oxygen saturations                     were monitored continuously. The Colonoscope was                     introduced through the anus and advanced to the the cecum,                     identified by appendiceal orifice and ileocecal valve. The                     colonoscopy was performed without difficulty. The patient                     tolerated the procedure well. The quality of the bowel                     preparation was good. Findings:      Three sessile polyps were found in the ascending colon and in the cecum.       The polyps were diminutive in size. These polyps were removed with a       jumbo cold forceps. Resection and retrieval were complete.      A 12 mm polyp was found in the cecum. The polyp was sessile. The polyp       was removed with a piecemeal technique using a hot snare. Resection and       retrieval were complete. To prevent bleeding after the polypectomy, two      hemostatic clips were successfully placed. There was no bleeding at the       end of the maneuver.      Internal hemorrhoids were found during endoscopy. The hemorrhoids were       small and Grade I (internal hemorrhoids  that do not prolapse). Impression:        - Three diminutive polyps in the ascending colon and in                     the cecum. Resected and retrieved.                    - One 12 mm polyp in the cecum. Resected and retrieved.                     Clips were placed.                    - Internal hemorrhoids. Recommendation:    - Await pathology results. Manya Silvas, MD 02/20/2015 2:54:30 PM This report has been signed electronically. Number of Addenda: 0 Note Initiated On: 02/20/2015 2:14 PM Scope Withdrawal Time: 0 hours 23 minutes 19 seconds  Total Procedure Duration: 0 hours 25 minutes 3 seconds       [facility]

## 2015-02-21 ENCOUNTER — Encounter: Payer: Self-pay | Admitting: Unknown Physician Specialty

## 2015-02-22 LAB — SURGICAL PATHOLOGY

## 2015-09-06 NOTE — Discharge Instructions (Signed)

## 2015-09-09 ENCOUNTER — Encounter: Payer: Self-pay | Admitting: *Deleted

## 2015-09-11 ENCOUNTER — Ambulatory Visit
Admission: RE | Admit: 2015-09-11 | Discharge: 2015-09-11 | Disposition: A | Discharge status: Home / Self Care | Payer: Medicare HMO | Source: Ambulatory Visit | Attending: Ophthalmology | Admitting: Ophthalmology

## 2015-09-11 ENCOUNTER — Ambulatory Visit: Payer: Medicare HMO | Admitting: Anesthesiology

## 2015-09-11 ENCOUNTER — Encounter
Admission: RE | Disposition: A | Discharge status: Home / Self Care | Payer: Self-pay | Source: Ambulatory Visit | Attending: Ophthalmology

## 2015-09-11 ENCOUNTER — Encounter: Payer: Self-pay | Admitting: *Deleted

## 2015-09-11 DIAGNOSIS — M1990 Unspecified osteoarthritis, unspecified site: Secondary | ICD-10-CM | POA: Insufficient documentation

## 2015-09-11 DIAGNOSIS — E7800 Pure hypercholesterolemia, unspecified: Secondary | ICD-10-CM | POA: Diagnosis not present

## 2015-09-11 DIAGNOSIS — H9193 Unspecified hearing loss, bilateral: Secondary | ICD-10-CM | POA: Insufficient documentation

## 2015-09-11 DIAGNOSIS — I10 Essential (primary) hypertension: Secondary | ICD-10-CM | POA: Diagnosis not present

## 2015-09-11 DIAGNOSIS — R0681 Apnea, not elsewhere classified: Secondary | ICD-10-CM | POA: Insufficient documentation

## 2015-09-11 DIAGNOSIS — Z9079 Acquired absence of other genital organ(s): Secondary | ICD-10-CM | POA: Insufficient documentation

## 2015-09-11 DIAGNOSIS — I739 Peripheral vascular disease, unspecified: Secondary | ICD-10-CM | POA: Diagnosis not present

## 2015-09-11 DIAGNOSIS — Z8673 Personal history of transient ischemic attack (TIA), and cerebral infarction without residual deficits: Secondary | ICD-10-CM | POA: Diagnosis not present

## 2015-09-11 DIAGNOSIS — H2511 Age-related nuclear cataract, right eye: Secondary | ICD-10-CM | POA: Diagnosis not present

## 2015-09-11 DIAGNOSIS — Z9841 Cataract extraction status, right eye: Secondary | ICD-10-CM | POA: Insufficient documentation

## 2015-09-11 HISTORY — PX: CATARACT EXTRACTION W/PHACO: SHX586

## 2015-09-11 HISTORY — DX: Sleep apnea, unspecified: G47.30

## 2015-09-11 HISTORY — DX: Cerebral infarction, unspecified: I63.9

## 2015-09-11 SURGERY — PHACOEMULSIFICATION, CATARACT, WITH IOL INSERTION
Anesthesia: Monitor Anesthesia Care | Laterality: Right

## 2015-09-11 MED ORDER — ARMC OPHTHALMIC DILATING GEL
1.0000 "application " | OPHTHALMIC | Status: DC | PRN
  Administered 2015-09-11 (×2): 1 via OPHTHALMIC

## 2015-09-11 MED ORDER — TETRACAINE HCL 0.5 % OP SOLN
1.0000 [drp] | OPHTHALMIC | Status: DC | PRN
  Administered 2015-09-11: 1 [drp] via OPHTHALMIC

## 2015-09-11 MED ORDER — TIMOLOL MALEATE 0.5 % OP SOLN
OPHTHALMIC | Status: DC | PRN
  Administered 2015-09-11: 1 [drp] via OPHTHALMIC

## 2015-09-11 MED ORDER — LIDOCAINE HCL (PF) 4 % IJ SOLN
INTRAOCULAR | Status: DC | PRN
  Administered 2015-09-11: 1 mL via OPHTHALMIC

## 2015-09-11 MED ORDER — LACTATED RINGERS IV SOLN: INTRAVENOUS | Status: DC

## 2015-09-11 MED ORDER — POVIDONE-IODINE 5 % OP SOLN
1.0000 "application " | OPHTHALMIC | Status: DC | PRN
  Administered 2015-09-11: 1 via OPHTHALMIC

## 2015-09-11 MED ORDER — EPINEPHRINE HCL 1 MG/ML IJ SOLN
INTRAMUSCULAR | Status: DC | PRN
  Administered 2015-09-11: 70 mL via OPHTHALMIC

## 2015-09-11 MED ORDER — BRIMONIDINE TARTRATE 0.2 % OP SOLN
OPHTHALMIC | Status: DC | PRN
  Administered 2015-09-11: 1 [drp] via OPHTHALMIC

## 2015-09-11 MED ORDER — CEFUROXIME OPHTHALMIC INJECTION 1 MG/0.1 ML
INJECTION | OPHTHALMIC | Status: DC | PRN
  Administered 2015-09-11: 0.1 mL via INTRACAMERAL

## 2015-09-11 MED ORDER — NA HYALUR & NA CHOND-NA HYALUR 0.4-0.35 ML IO KIT
PACK | INTRAOCULAR | Status: DC | PRN
  Administered 2015-09-11: 1 mL via INTRAOCULAR

## 2015-09-11 MED ORDER — MIDAZOLAM HCL 2 MG/2ML IJ SOLN
INTRAMUSCULAR | Status: DC | PRN
  Administered 2015-09-11: 1 mg via INTRAVENOUS

## 2015-09-11 MED ORDER — FENTANYL CITRATE (PF) 100 MCG/2ML IJ SOLN
INTRAMUSCULAR | Status: DC | PRN
  Administered 2015-09-11: 50 ug via INTRAVENOUS

## 2015-09-11 SURGICAL SUPPLY — 25 items
CANNULA ANT/CHMB 27GA (MISCELLANEOUS) ×3 IMPLANT
CARTRIDGE ABBOTT (MISCELLANEOUS) IMPLANT
GLOVE SURG LX 7.5 STRW (GLOVE) ×2
GLOVE SURG LX STRL 7.5 STRW (GLOVE) ×1 IMPLANT
GLOVE SURG TRIUMPH 8.0 PF LTX (GLOVE) ×3 IMPLANT
GOWN STRL REUS W/ TWL LRG LVL3 (GOWN DISPOSABLE) ×2 IMPLANT
GOWN STRL REUS W/TWL LRG LVL3 (GOWN DISPOSABLE) ×4
LENS IOL TECNIS ITEC 15.0 (Intraocular Lens) ×3 IMPLANT
MARKER SKIN DUAL TIP RULER LAB (MISCELLANEOUS) ×3 IMPLANT
NDL RETROBULBAR .5 NSTRL (NEEDLE) IMPLANT
NEEDLE FILTER BLUNT 18X 1/2SAF (NEEDLE) ×2
NEEDLE FILTER BLUNT 18X1 1/2 (NEEDLE) ×1 IMPLANT
PACK CATARACT BRASINGTON (MISCELLANEOUS) ×3 IMPLANT
PACK EYE AFTER SURG (MISCELLANEOUS) ×3 IMPLANT
PACK OPTHALMIC (MISCELLANEOUS) ×3 IMPLANT
RING MALYGIN 7.0 (MISCELLANEOUS) IMPLANT
SUT ETHILON 10-0 CS-B-6CS-B-6 (SUTURE)
SUT VICRYL  9 0 (SUTURE)
SUT VICRYL 9 0 (SUTURE) IMPLANT
SUTURE EHLN 10-0 CS-B-6CS-B-6 (SUTURE) IMPLANT
SYR 3ML LL SCALE MARK (SYRINGE) ×3 IMPLANT
SYR 5ML LL (SYRINGE) ×3 IMPLANT
SYR TB 1ML LUER SLIP (SYRINGE) ×3 IMPLANT
WATER STERILE IRR 250ML POUR (IV SOLUTION) ×3 IMPLANT
WIPE NON LINTING 3.25X3.25 (MISCELLANEOUS) ×3 IMPLANT

## 2015-09-11 NOTE — H&P (Signed)
  The History and Physical notes are on paper, have been signed, and are to be scanned. The patient remains stable and unchanged from the H&P.   Previous H&P reviewed, patient examined, and there are no changes.  Wyndham Santilli 09/11/2015 7:34 AM

## 2015-09-11 NOTE — Transfer of Care (Signed)
Immediate Anesthesia Transfer of Care Note  Patient: Charles Joyce  Procedure(s) Performed: Procedure(s) with comments: CATARACT EXTRACTION PHACO AND INTRAOCULAR LENS PLACEMENT (IOC) (Right) - sleep apnea  Patient Location: PACU  Anesthesia Type: MAC  Level of Consciousness: awake, alert  and patient cooperative  Airway and Oxygen Therapy: Patient Spontanous Breathing and Patient connected to supplemental oxygen  Post-op Assessment: Post-op Vital signs reviewed, Patient's Cardiovascular Status Stable, Respiratory Function Stable, Patent Airway and No signs of Nausea or vomiting  Post-op Vital Signs: Reviewed and stable  Complications: No apparent anesthesia complications

## 2015-09-11 NOTE — Op Note (Signed)
LOCATION:  [place]   PREOPERATIVE DIAGNOSIS:    Nuclear sclerotic cataract right eye. H25.11   POSTOPERATIVE DIAGNOSIS:  Nuclear sclerotic cataract right eye.     PROCEDURE:  Phacoemusification with posterior chamber intraocular lens placement of the right eye   LENS:   Implant Name Type Inv. Item Serial No. Manufacturer Lot No. LRB No. Used  LENS IOL DIOP 15.0 - [id number] Intraocular Lens LENS IOL DIOP 15.0 [id number]   Right 1        ULTRASOUND TIME: 9 % of 1 minutes, 12 seconds.  CDE 6.4   SURGEON:  Wyonia Hough, MD   ANESTHESIA:  Topical with tetracaine drops and 2% Xylocaine jelly, augmented with 1% preservative-free intracameral lidocaine.    COMPLICATIONS:  None.   DESCRIPTION OF PROCEDURE:  The patient was identified in the holding room and transported to the operating room and placed in the supine position under the operating microscope.  The right eye was identified as the operative eye and it was prepped and draped in the usual sterile ophthalmic fashion.   A 1 millimeter clear-corneal paracentesis was made at the 12:00 position.  0.5 ml of preservative-free 1% lidocaine was injected into the anterior chamber. The anterior chamber was filled with Viscoat viscoelastic.  A 2.4 millimeter keratome was used to make a near-clear corneal incision at the 9:00 position.  A curvilinear capsulorrhexis was made with a cystotome and capsulorrhexis forceps.  Balanced salt solution was used to hydrodissect and hydrodelineate the nucleus.   Phacoemulsification was then used in stop and chop fashion to remove the lens nucleus and epinucleus.  The remaining cortex was then removed using the irrigation and aspiration handpiece. Provisc was then placed into the capsular bag to distend it for lens placement.  A lens was then injected into the capsular bag.  The remaining viscoelastic was aspirated.   Wounds were hydrated with balanced salt solution.  The anterior  chamber was inflated to a physiologic pressure with balanced salt solution.  No wound leaks were noted. Cefuroxime 0.1 ml of a 10mg /ml solution was injected into the anterior chamber for a dose of 1 mg of intracameral antibiotic at the completion of the case.   Timolol and Brimonidine drops were applied to the eye.  The patient was taken to the recovery room in stable condition without complications of anesthesia or surgery.   Promiss Labarbera 09/11/2015, 8:27 AM

## 2015-09-11 NOTE — Anesthesia Postprocedure Evaluation (Signed)
Anesthesia Post Note  Patient: Charles Joyce  Procedure(s) Performed: Procedure(s) (LRB): CATARACT EXTRACTION PHACO AND INTRAOCULAR LENS PLACEMENT (IOC) (Right)  Patient location during evaluation: PACU Anesthesia Type: General Level of consciousness: awake and alert Pain management: pain level controlled Vital Signs Assessment: post-procedure vital signs reviewed and stable Respiratory status: spontaneous breathing, nonlabored ventilation, respiratory function stable and patient connected to nasal cannula oxygen Cardiovascular status: blood pressure returned to baseline and stable Postop Assessment: no signs of nausea or vomiting Anesthetic complications: no    Marshell Levan

## 2015-09-11 NOTE — Anesthesia Procedure Notes (Signed)
Procedure Name: MAC Performed by: Nikie Cid Pre-anesthesia Checklist: Patient identified, Emergency Drugs available, Suction available, Patient being monitored and Timeout performed Patient Re-evaluated:Patient Re-evaluated prior to inductionOxygen Delivery Method: Nasal cannula       

## 2015-09-11 NOTE — Anesthesia Preprocedure Evaluation (Addendum)
Anesthesia Evaluation  Patient identified by MRN, date of birth, ID band Patient awake    Reviewed: Allergy & Precautions, NPO status , Patient's Chart, lab work & pertinent test results, reviewed documented beta blocker date and time   Airway Mallampati: III  TM Distance: >3 FB Neck ROM: Full    Dental  (+) Chipped   Pulmonary neg pulmonary ROS, sleep apnea ,    Pulmonary exam normal breath sounds clear to auscultation       Cardiovascular hypertension, Pt. on medications and Pt. on home beta blockers + Peripheral Vascular Disease  negative cardio ROS Normal cardiovascular exam     Neuro/Psych Visual loss in right upper section of right eye... Hx of AVM in head... Numbness and tingling of right arm and leg TIACVA, Residual Symptoms negative neurological ROS  negative psych ROS   GI/Hepatic negative GI ROS, Neg liver ROS,   Endo/Other  negative endocrine ROS  Renal/GU negative Renal ROS  negative genitourinary   Musculoskeletal negative musculoskeletal ROS (+)   Abdominal   Peds negative pediatric ROS (+)  Hematology negative hematology ROS (+)   Anesthesia Other Findings Hx of AVM repair  Reproductive/Obstetrics negative OB ROS                            Anesthesia Physical Anesthesia Plan  ASA: II  Anesthesia Plan: MAC   Post-op Pain Management:    Induction: Intravenous  Airway Management Planned:   Additional Equipment:   Intra-op Plan:   Post-operative Plan:   Informed Consent: I have reviewed the patients History and Physical, chart, labs and discussed the procedure including the risks, benefits and alternatives for the proposed anesthesia with the patient or authorized representative who has indicated his/her understanding and acceptance.     Plan Discussed with: CRNA  Anesthesia Plan Comments:         Anesthesia Quick Evaluation                                   Anesthesia Evaluation  Patient identified by MRN, date of birth, ID band Patient awake    Reviewed: Allergy & Precautions, NPO status , Patient's Chart, lab work & pertinent test results, reviewed documented beta blocker date and time   Airway Mallampati: III  TM Distance: >3 FB Neck ROM: Full    Dental  (+) Chipped   Pulmonary sleep apnea ,    Pulmonary exam normal breath sounds clear to auscultation       Cardiovascular hypertension, Pt. on medications and Pt. on home beta blockers + Peripheral Vascular Disease  Normal cardiovascular exam     Neuro/Psych Visual loss in right upper section of right eye... Hx of AVM in head... Numbness and tingling of right arm and leg TIACVA, Residual Symptoms    GI/Hepatic negative GI ROS, Neg liver ROS,   Endo/Other  negative endocrine ROS  Renal/GU negative Renal ROS     Musculoskeletal negative musculoskeletal ROS (+)   Abdominal Normal abdominal exam  (+)   Peds  Hematology negative hematology ROS (+)   Anesthesia Other Findings Hx of AVM repair  Reproductive/Obstetrics                            Anesthesia Physical Anesthesia Plan  ASA: III  Anesthesia Plan: General   Post-op Pain Management:  Induction: Intravenous  Airway Management Planned: Nasal Cannula  Additional Equipment:   Intra-op Plan:   Post-operative Plan:   Informed Consent: I have reviewed the patients History and Physical, chart, labs and discussed the procedure including the risks, benefits and alternatives for the proposed anesthesia with the patient or authorized representative who has indicated his/her understanding and acceptance.   Dental advisory given  Plan Discussed with: CRNA and Surgeon  Anesthesia Plan Comments:         Anesthesia Quick Evaluation

## 2015-09-12 ENCOUNTER — Encounter: Payer: Self-pay | Admitting: Ophthalmology

## 2016-05-15 ENCOUNTER — Encounter: Payer: Self-pay | Admitting: *Deleted

## 2016-05-18 ENCOUNTER — Ambulatory Visit: Payer: Medicare HMO | Admitting: Anesthesiology

## 2016-05-18 ENCOUNTER — Encounter
Admission: RE | Disposition: A | Discharge status: Home / Self Care | Payer: Self-pay | Source: Ambulatory Visit | Attending: Unknown Physician Specialty

## 2016-05-18 ENCOUNTER — Ambulatory Visit
Admission: RE | Admit: 2016-05-18 | Discharge: 2016-05-18 | Disposition: A | Discharge status: Home / Self Care | Payer: Medicare HMO | Source: Ambulatory Visit | Attending: Unknown Physician Specialty | Admitting: Unknown Physician Specialty

## 2016-05-18 ENCOUNTER — Encounter: Admission: RE | Payer: Self-pay | Source: Ambulatory Visit

## 2016-05-18 ENCOUNTER — Ambulatory Visit
Admission: RE | Admit: 2016-05-18 | Payer: Medicare HMO | Source: Ambulatory Visit | Admitting: Unknown Physician Specialty

## 2016-05-18 ENCOUNTER — Encounter: Payer: Self-pay | Admitting: *Deleted

## 2016-05-18 DIAGNOSIS — K635 Polyp of colon: Secondary | ICD-10-CM | POA: Diagnosis not present

## 2016-05-18 DIAGNOSIS — H547 Unspecified visual loss: Secondary | ICD-10-CM | POA: Diagnosis not present

## 2016-05-18 DIAGNOSIS — Z8679 Personal history of other diseases of the circulatory system: Secondary | ICD-10-CM | POA: Diagnosis not present

## 2016-05-18 DIAGNOSIS — Z9889 Other specified postprocedural states: Secondary | ICD-10-CM | POA: Diagnosis not present

## 2016-05-18 DIAGNOSIS — Z1211 Encounter for screening for malignant neoplasm of colon: Secondary | ICD-10-CM | POA: Insufficient documentation

## 2016-05-18 DIAGNOSIS — Z833 Family history of diabetes mellitus: Secondary | ICD-10-CM | POA: Insufficient documentation

## 2016-05-18 DIAGNOSIS — I4891 Unspecified atrial fibrillation: Secondary | ICD-10-CM | POA: Insufficient documentation

## 2016-05-18 DIAGNOSIS — Z818 Family history of other mental and behavioral disorders: Secondary | ICD-10-CM | POA: Insufficient documentation

## 2016-05-18 DIAGNOSIS — K648 Other hemorrhoids: Secondary | ICD-10-CM | POA: Insufficient documentation

## 2016-05-18 DIAGNOSIS — Z7982 Long term (current) use of aspirin: Secondary | ICD-10-CM | POA: Diagnosis not present

## 2016-05-18 DIAGNOSIS — Z8673 Personal history of transient ischemic attack (TIA), and cerebral infarction without residual deficits: Secondary | ICD-10-CM | POA: Insufficient documentation

## 2016-05-18 DIAGNOSIS — Z9841 Cataract extraction status, right eye: Secondary | ICD-10-CM | POA: Diagnosis not present

## 2016-05-18 DIAGNOSIS — I10 Essential (primary) hypertension: Secondary | ICD-10-CM | POA: Insufficient documentation

## 2016-05-18 DIAGNOSIS — Z961 Presence of intraocular lens: Secondary | ICD-10-CM | POA: Insufficient documentation

## 2016-05-18 DIAGNOSIS — Z9079 Acquired absence of other genital organ(s): Secondary | ICD-10-CM | POA: Insufficient documentation

## 2016-05-18 DIAGNOSIS — Z86010 Personal history of colonic polyps: Secondary | ICD-10-CM | POA: Diagnosis not present

## 2016-05-18 DIAGNOSIS — E785 Hyperlipidemia, unspecified: Secondary | ICD-10-CM | POA: Diagnosis not present

## 2016-05-18 DIAGNOSIS — Z79899 Other long term (current) drug therapy: Secondary | ICD-10-CM | POA: Insufficient documentation

## 2016-05-18 HISTORY — PX: COLONOSCOPY WITH PROPOFOL: SHX5780

## 2016-05-18 SURGERY — COLONOSCOPY WITH PROPOFOL
Anesthesia: General

## 2016-05-18 MED ORDER — SODIUM CHLORIDE 0.9 % IV SOLN
INTRAVENOUS | Status: DC
  Administered 2016-05-18: 1000 mL via INTRAVENOUS

## 2016-05-18 MED ORDER — LIDOCAINE 2% (20 MG/ML) 5 ML SYRINGE
INTRAMUSCULAR | Status: DC | PRN
  Administered 2016-05-18: 40 mg via INTRAVENOUS

## 2016-05-18 MED ORDER — FENTANYL CITRATE (PF) 100 MCG/2ML IJ SOLN
INTRAMUSCULAR | Status: DC | PRN
  Administered 2016-05-18: 50 ug via INTRAVENOUS

## 2016-05-18 MED ORDER — MIDAZOLAM HCL 5 MG/5ML IJ SOLN
INTRAMUSCULAR | Status: DC | PRN
  Administered 2016-05-18: 1 mg via INTRAVENOUS

## 2016-05-18 MED ORDER — PROPOFOL 500 MG/50ML IV EMUL
INTRAVENOUS | Status: DC | PRN
  Administered 2016-05-18: 140 ug/kg/min via INTRAVENOUS

## 2016-05-18 MED ORDER — PROPOFOL 10 MG/ML IV BOLUS
INTRAVENOUS | Status: DC | PRN
  Administered 2016-05-18: 100 mg via INTRAVENOUS

## 2016-05-18 MED ORDER — EPHEDRINE SULFATE 50 MG/ML IJ SOLN
INTRAMUSCULAR | Status: DC | PRN
  Administered 2016-05-18: 10 mg via INTRAVENOUS

## 2016-05-18 MED ORDER — PHENYLEPHRINE HCL 10 MG/ML IJ SOLN
INTRAMUSCULAR | Status: DC | PRN
  Administered 2016-05-18 (×2): 100 ug via INTRAVENOUS

## 2016-05-18 NOTE — Anesthesia Post-op Follow-up Note (Cosign Needed)
Anesthesia QCDR form completed.        

## 2016-05-18 NOTE — Anesthesia Preprocedure Evaluation (Signed)
Anesthesia Evaluation  Patient identified by MRN, date of birth, ID band Patient awake    Reviewed: Allergy & Precautions, NPO status , Patient's Chart, lab work & pertinent test results  History of Anesthesia Complications Negative for: history of anesthetic complications  Airway Mallampati: I  TM Distance: >3 FB Neck ROM: Full    Dental no notable dental hx.    Pulmonary sleep apnea (does not use CPAP) , neg COPD,    breath sounds clear to auscultation- rhonchi (-) wheezing      Cardiovascular Exercise Tolerance: Good hypertension, Pt. 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               due to treatment of AVM in brain No date: Cataracts, bilateral NOV 2015: CVA (cerebral vascular accident) ([place])     Comment: [place] No date: Hearing loss No date: History of prostatectomy No date: Hyperlipidemia No date: Hypertension No date: Sleep apnea     Comment: No CPAP No date: 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 No date: Stroke [facility])     Comment: TIA 2012 No date: Visual loss   Reproductive/Obstetrics                             Anesthesia Physical Anesthesia Plan  ASA: III  Anesthesia Plan: General   Post-op Pain Management:    Induction: Intravenous  Airway Management Planned: Natural  Airway  Additional Equipment:   Intra-op Plan:   Post-operative Plan:   Informed Consent: I have reviewed the patients History and Physical, chart, labs and discussed the procedure including the risks, benefits and alternatives for the proposed anesthesia with the patient or authorized representative who has indicated his/her understanding and acceptance.   Dental advisory given  Plan Discussed with: CRNA and Anesthesiologist  Anesthesia Plan Comments:         Anesthesia Quick Evaluation

## 2016-05-18 NOTE — Transfer of Care (Signed)
Immediate Anesthesia Transfer of Care Note  Patient: Charles Joyce  Procedure(s) Performed: Procedure(s): COLONOSCOPY WITH PROPOFOL (N/A)  Patient Location: PACU and Endoscopy Unit  Anesthesia Type:General  Level of Consciousness: sedated  Airway & Oxygen Therapy: Patient Spontanous Breathing and Patient connected to nasal cannula oxygen  Post-op Assessment: Report given to RN and Post -op Vital signs reviewed and stable  Post vital signs: Reviewed and stable  Last Vitals:  Vitals:   05/18/16 1045  BP: (!) 156/80  Pulse: 69  Resp: 16  Temp: 36.3 C    Last Pain:  Vitals:   05/18/16 1045  TempSrc: Tympanic         Complications: No apparent anesthesia complications

## 2016-05-18 NOTE — Anesthesia Postprocedure Evaluation (Signed)
Anesthesia Post Note  Patient: Charles Joyce  Procedure(s) Performed: Procedure(s) (LRB): COLONOSCOPY WITH PROPOFOL (N/A)  Patient location during evaluation: Endoscopy Anesthesia Type: General Level of consciousness: awake and alert and oriented Pain management: pain level controlled Vital Signs Assessment: post-procedure vital signs reviewed and stable Respiratory status: spontaneous breathing, nonlabored ventilation and respiratory function stable Cardiovascular status: blood pressure returned to baseline and stable Postop Assessment: no signs of nausea or vomiting Anesthetic complications: no     Last Vitals:  Vitals:   05/18/16 1235 05/18/16 1245  BP: 130/82 (!) 144/78  Pulse: 71 66  Resp: (!) 22 18  Temp:      Last Pain:  Vitals:   05/18/16 1215  TempSrc: Tympanic                 Cheralyn Oliver

## 2016-05-18 NOTE — Op Note (Signed)
[facility] Gastroenterology Patient Name: Charles Joyce Procedure Date: 05/18/2016 11:34 AM MRN: [id number] Account #: [id number] Date of Birth: 03-Oct-1940 Admit Type: Outpatient Age: 76 Room: [facility] ENDO ROOM 1 Gender: Male Note Status: Finalized Procedure:            Colonoscopy Indications:          High risk colon cancer surveillance: Personal history                        of colonic polyps Providers:            Manya Silvas, MD Referring MD:         Ocie Cornfield. Ouida Sills MD, MD (Referring MD) Medicines:            Propofol per Anesthesia Complications:        No immediate complications. Procedure:            Pre-Anesthesia Assessment:                       - After reviewing the risks and benefits, the patient                        was deemed in satisfactory condition to undergo the                        procedure.                       After obtaining informed consent, the colonoscope was                        passed under direct vision. Throughout the procedure,                        the patient's blood pressure, pulse, and oxygen                        saturations were monitored continuously. The                        Colonoscope was introduced through the anus and                        advanced to the the cecum, identified by appendiceal                        orifice and ileocecal valve. The colonoscopy was                        performed without difficulty. The patient tolerated the                        procedure well. The quality of the bowel preparation                        was excellent. Findings:      Two sessile polyps were found in the cecum. The polyps were diminutive       in size. These polyps were removed with a jumbo cold forceps. Resection       and retrieval were complete. To help with the removal of the  polyps 9 ml       of saline were injected into the mucosa in the cecum to raise the mucosa       and no other polyps  were seen. Some bleeding noted from one of these and       a clip applied with good effect. Small internal hemorrhoids seen. Impression:           - Two diminutive polyps in the cecum, removed with a                        jumbo cold forceps. Resected and retrieved. Recommendation:       - Await pathology results. Consider repeat in 2-3 years. Manya Silvas, MD 05/18/2016 12:12:25 PM This report has been signed electronically. Number of Addenda: 0 Note Initiated On: 05/18/2016 11:34 AM Scope Withdrawal Time: 0 hours 15 minutes 6 seconds  Total Procedure Duration: 0 hours 17 minutes 53 seconds       [facility]

## 2016-05-18 NOTE — H&P (Signed)
Primary Care Physician:  Kirk Ruths., MD Primary Gastroenterologist:  Dr. Vira Agar  Pre-Procedure History & Physical: HPI:  Charles Joyce is a 76 y.o. male is here for an colonoscopy.   Past Medical History:  Diagnosis Date  . A-fib ([place])   . AVM (arteriovenous malformation) brain   . 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 Cataracts, bilateral   . CVA (cerebral vascular accident) ([place]) NOV 2015   [place]  . Hearing loss   . History of prostatectomy   . Hyperlipidemia   . Hypertension   . Sleep apnea    No CPAP  . 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 Stroke [facility])    TIA 2012  . Visual loss     Past Surgical History:  Procedure Laterality Date  . BRAIN AVM REPAIR  2011   RIGHT BRAIN EMBOLIZATION  . BRAIN AVM REPAIR  2014   RIGHT BRAIN RE-EMBOLIZATION & EXCISION OF SPINAL ABSCESS  . CARDIAC CATHETERIZATION    . CATARACT EXTRACTION W/PHACO Right 09/11/2015   Procedure: CATARACT EXTRACTION PHACO AND INTRAOCULAR LENS PLACEMENT (IOC);  Surgeon: Leandrew Koyanagi, MD;  Location: [facility];  Service: Ophthalmology;  Laterality: Right;  sleep apnea  . cataract surgery    . COLONOSCOPY WITH PROPOFOL N/A 02/20/2015   Procedure: COLONOSCOPY WITH PROPOFOL;  Surgeon: Manya Silvas, MD;  Location: [facility] ENDOSCOPY;  Service: Endoscopy;  Laterality: N/A;  . PROSTATECTOMY  2008 / 2013   GREEN LIGHT LASER / PROSTATECTOMY  . TONSILLECTOMY  1956    Prior to Admission medications   Medication Sig Start Date End Date Taking? Authorizing Provider  metoprolol (TOPROL-XL) 200 MG 24 hr tablet Take 200 mg by mouth daily. AM   Yes Historical Provider, MD  acetaminophen (TYLENOL ARTHRITIS PAIN) 650 MG CR tablet Take 650 mg by mouth every 8 (eight) hours as needed for pain. BREAKFAST AND BEDTIME    Historical Provider, MD  aspirin 325 MG tablet Take 1 tablet (325 mg total) by mouth daily. Patient taking differently:  Take 325 mg by mouth daily. AM 03/07/14   Bonnielee Haff, MD  Cholecalciferol (VITAMIN D3) 2000 units TABS Take by mouth. LUNCH    Historical Provider, MD  Multiple Vitamins-Minerals (ICAPS AREDS FORMULA PO) Take 2 capsules by mouth daily. BREAKFAST AND BEDTIME    Historical Provider, MD  sildenafil (VIAGRA) 100 MG tablet Take 100 mg by mouth daily as needed for erectile dysfunction. Reported on 09/11/2015    Historical Provider, MD  simvastatin (ZOCOR) 20 MG tablet Take 1 tablet (20 mg total) by mouth daily. Patient taking differently: Take 20 mg by mouth daily. PM 03/07/14   Bonnielee Haff, MD    Allergies as of 05/15/2016  . (No Known Allergies)    Family History  Problem Relation Age of Onset  . Diabetes      Mother  . Alzheimer's disease      Mother    Social History   Social History  . Marital status: Married    Spouse name: N/A  . Number of children: N/A  . Years of education: N/A   Occupational History  . Not on file.   Social History Main Topics  . Smoking status: Never Smoker  . Smokeless tobacco: Never Used  . Alcohol use Yes  . Drug use: No  . Sexual activity: Not on file   Other Topics Concern  .  Not on file   Social History Narrative  . No narrative on file    Review of Systems: See HPI, otherwise negative ROS  Physical Exam: BP (!) 156/80   Pulse 69   Temp 97.4 F (36.3 C) (Tympanic)   Resp 16   Ht 5\' 5"  (1.651 m)   Wt 74.4 kg (164 lb)   SpO2 98%   BMI 27.29 kg/m  General:   Alert,  pleasant and cooperative in NAD Head:  Normocephalic and atraumatic. Neck:  Supple; no masses or thyromegaly. Lungs:  Clear throughout to auscultation.    Heart:  Regular rate and rhythm. Abdomen:  Soft, nontender and nondistended. Normal bowel sounds, without guarding, and without rebound.   Neurologic:  Alert and  oriented x4;  grossly normal neurologically.  Impression/Plan: Charles Joyce is here for an colonoscopy to be performed for [facility] colon  polyps  Risks, benefits, limitations, and alternatives regarding  colonoscopy have been reviewed with the patient.  Questions have been answered.  All parties agreeable.   Gaylyn Cheers, MD  05/18/2016, 11:33 AM

## 2016-05-21 LAB — SURGICAL PATHOLOGY

## 2016-05-25 ENCOUNTER — Encounter: Payer: Self-pay | Admitting: Unknown Physician Specialty

## 2020-06-16 ENCOUNTER — Emergency Department: Payer: Medicare HMO

## 2020-06-16 ENCOUNTER — Other Ambulatory Visit: Payer: Self-pay

## 2020-06-16 ENCOUNTER — Emergency Department
Admission: EM | Admit: 2020-06-16 | Discharge: 2020-06-16 | Disposition: A | Discharge status: Home / Self Care | Payer: Medicare HMO | Attending: Emergency Medicine | Admitting: Emergency Medicine

## 2020-06-16 ENCOUNTER — Encounter: Payer: Self-pay | Admitting: Emergency Medicine

## 2020-06-16 DIAGNOSIS — G44221 Chronic tension-type headache, intractable: Secondary | ICD-10-CM | POA: Insufficient documentation

## 2020-06-16 DIAGNOSIS — H538 Other visual disturbances: Secondary | ICD-10-CM | POA: Diagnosis not present

## 2020-06-16 DIAGNOSIS — M542 Cervicalgia: Secondary | ICD-10-CM | POA: Insufficient documentation

## 2020-06-16 DIAGNOSIS — Z7982 Long term (current) use of aspirin: Secondary | ICD-10-CM | POA: Insufficient documentation

## 2020-06-16 DIAGNOSIS — H539 Unspecified visual disturbance: Secondary | ICD-10-CM

## 2020-06-16 DIAGNOSIS — I10 Essential (primary) hypertension: Secondary | ICD-10-CM | POA: Diagnosis not present

## 2020-06-16 DIAGNOSIS — G44201 Tension-type headache, unspecified, intractable: Secondary | ICD-10-CM

## 2020-06-16 DIAGNOSIS — R519 Headache, unspecified: Secondary | ICD-10-CM | POA: Diagnosis present

## 2020-06-16 DIAGNOSIS — Z79899 Other long term (current) drug therapy: Secondary | ICD-10-CM | POA: Insufficient documentation

## 2020-06-16 NOTE — ED Triage Notes (Signed)
Pt to ED via POV, Pt states that he has a headache that started 10-15 minutes ago. Pt states that he did not take anything for pain. Pt states that it is not the worse headache of his life. Pt is in NAD.

## 2020-06-16 NOTE — Discharge Instructions (Addendum)
You were seen today for an acute headache and vision changes in your right eye.  Your CT scan showed stable coiling of your AV malformation and no acute intracranial clot or bleed.  You can take Tylenol when you get home.  Please follow-up with your PCP, neurology and ophthalmology for further evaluation of the symptoms.  Return to the ER if headache persists, worsens or visual changes return.

## 2020-06-16 NOTE — ED Notes (Signed)
Pt ambulatory to the room. States headache starting a few mins ago with hx of AV malf, stroke. Pt is on thinners. Pt denies any other symptoms at this time. PERRLA. No neurological symptoms except headache.

## 2020-06-16 NOTE — ED Provider Notes (Signed)
[facility] Emergency Department Provider Note ____________________________________________  Time seen: 1100  I have reviewed the triage vital signs and the nursing notes.  HISTORY  Chief Complaint  Headache   HPI Charles Joyce is a 80 y.o. male presents to the ER today with acute onset headache that started 45 minutes prior to arrival.  He describes the pain as sharp.  The pain is located in the back of the head and wraps around the sides of the head into the forehead.  He does report some associated tightness in his neck and shoulders but denies overt neck pain.  He reports some visual distortion in his right eye that is associated with this headache but has improved since he has been in the ER.  Call he reports it looks like wavy lines in his field of vision.  He denies blurred vision or double vision.  He reports history of vision loss in both eyes since his prior stroke.  He did see his ophthalmologist 3 days ago but was not having this issue at this time.  He denies dizziness, confusion, slurred speech, unilateral weakness, numbness or tingling.  He has not taken any medications for this PTA.  Past Medical History:  Diagnosis Date  . A-fib ([place])   . AVM (arteriovenous malformation) brain   . 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 Cataracts, bilateral   . CVA (cerebral vascular accident) ([place]) NOV 2015   [place]  . Hearing loss   . History of prostatectomy   . Hyperlipidemia   . Hypertension   . Sleep apnea    No CPAP  . 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 Stroke [facility])    TIA 2012  . Visual loss     Patient Active Problem List   Diagnosis Date Noted  . Stroke ([place])   . Embolic stroke ([place])   . TIA (transient ischemic attack) 03/05/2014  . OSA (obstructive sleep apnea) 03/05/2014  . AVM (arteriovenous malformation) 03/05/2014  . HTN (hypertension) 03/05/2014  . HLD (hyperlipidemia)  03/05/2014  . Numbness and tingling of right arm and leg 03/05/2014    Past Surgical History:  Procedure Laterality Date  . BRAIN AVM REPAIR  2011   RIGHT BRAIN EMBOLIZATION  . BRAIN AVM REPAIR  2014   RIGHT BRAIN RE-EMBOLIZATION & EXCISION OF SPINAL ABSCESS  . CARDIAC CATHETERIZATION    . CATARACT EXTRACTION W/PHACO Right 09/11/2015   Procedure: CATARACT EXTRACTION PHACO AND INTRAOCULAR LENS PLACEMENT (IOC);  Surgeon: Leandrew Koyanagi, MD;  Location: [place];  Service: Ophthalmology;  Laterality: Right;  sleep apnea  . cataract surgery    . COLONOSCOPY WITH PROPOFOL N/A 02/20/2015   Procedure: COLONOSCOPY WITH PROPOFOL;  Surgeon: Manya Silvas, MD;  Location: [place] [facility] ENDOSCOPY;  Service: Endoscopy;  Laterality: N/A;  . COLONOSCOPY WITH PROPOFOL N/A 05/18/2016   Procedure: COLONOSCOPY WITH PROPOFOL;  Surgeon: Manya Silvas, MD;  Location: [facility] ENDOSCOPY;  Service: Endoscopy;  Laterality: N/A;  . PROSTATECTOMY  2008 / 2013   GREEN LIGHT LASER / PROSTATECTOMY  . TONSILLECTOMY  1956    Prior to Admission medications   Medication Sig Start Date End Date Taking? Authorizing Provider  acetaminophen (TYLENOL ARTHRITIS PAIN) 650 MG CR tablet Take 650 mg by mouth every 8 (eight) hours as needed for pain. BREAKFAST AND BEDTIME    [provider]  aspirin 325 MG tablet  Take 1 tablet (325 mg total) by mouth daily. Patient taking differently: Take 325 mg by mouth daily. AM 03/07/14   Bonnielee Haff, MD  Cholecalciferol (VITAMIN D3) 2000 units TABS Take by mouth. LUNCH    [provider]  metoprolol (TOPROL-XL) 200 MG 24 hr tablet Take 200 mg by mouth daily. AM    [provider]  Multiple Vitamins-Minerals (ICAPS AREDS FORMULA PO) Take 2 capsules by mouth daily. BREAKFAST AND BEDTIME    [provider]  sildenafil (VIAGRA) 100 MG tablet Take 100 mg by mouth daily as needed for erectile dysfunction. Reported on 09/11/2015    [provider]   simvastatin (ZOCOR) 20 MG tablet Take 1 tablet (20 mg total) by mouth daily. Patient taking differently: Take 20 mg by mouth daily. PM 03/07/14   Bonnielee Haff, MD    Allergies Patient has no known allergies.  Family History  Problem Relation Age of Onset  . Diabetes Other        Mother  . Alzheimer's disease Other        Mother    Social History Social History   Tobacco Use  . Smoking status: Never Smoker  . Smokeless tobacco: Never Used  Substance Use Topics  . Alcohol use: Yes  . Drug use: No    Review of Systems  Constitutional: Negative for fever, chills or body aches. Eyes: Positive for visual distortion right eye.  Negative for eye pain, redness or drainage. ENT: Negative for runny nose, nasal congestion, ear pain or sore throat. Cardiovascular: Negative for chest pain or chest tightness. Respiratory: Negative for cough or shortness of breath. Gastrointestinal: Negative for nausea, vomiting and diarrhea. Musculoskeletal: Positive for tightness in the neck and shoulders.  Negative for neck or back pain. Skin: Negative for rash. Neurological: Positive for headache.  Negative for dizziness, focal weakness, tingling or numbness. ____________________________________________  PHYSICAL EXAM:  VITAL SIGNS: ED Triage Vitals [06/16/20 1031]  Enc Vitals Group     BP (!) 180/96     Pulse Rate 81     Resp 16     Temp 97.8 F (36.6 C)     Temp Source Oral     SpO2 99 %     Weight 172 lb (78 kg)     Height 5\' 5"  (1.651 m)     Head Circumference      Peak Flow      Pain Score 4     Pain Loc      Pain Edu?      Excl. in [place]?     Constitutional: Alert and oriented.  Obese, in no distress. Head: Normocephalic and atraumatic. Eyes: Conjunctivae are normal. PERRL.  Normal retinal exam on the right.  Normal extraocular movements Cardiovascular: Normal rate, regular rhythm.  Respiratory: Normal respiratory effort. No wheezes/rales/rhonchi. Musculoskeletal: No  difficulty with gait. Neurologic:  Normal speech and language.  Coordination normal.  No gross focal neurologic deficits are appreciated. Skin:  Skin is warm, dry and intact. No rash noted.  ____________________________________________    LABS  ____________________________________________   RADIOLOGY  Imaging Orders     CT Head Wo Contrast IMPRESSION:  Evidence of previous arteriovenous malformation coiling in the left  occipital lobe with encephalomalacia in this area.    Evidence of prior infarcts in the left thalamus and immediately  adjacent to the frontal horn the right lateral ventricle. No acute  infarct evident. No mass or hemorrhage.    There are foci of arterial  vascular calcification.    ____________________________________________   INITIAL IMPRESSION / ASSESSMENT AND PLAN / ED COURSE  Acute Headache, Visual Distortion, Right Eye:  DDx include ocular migraine, TIA CT head negative for acute findings Vision screening -see results in chart Repeat BP 144/84 (baseline) He declines medication for his headache at this time He agrees with discharge, and would like to follow up with PCP, neurology and ophthalmology as an outpatient   ____________________________________________  FINAL CLINICAL IMPRESSION(S) / ED DIAGNOSES  Final diagnoses:  Acute intractable tension-type headache  Visual changes      Jearld Fenton, NP 06/16/20 1211    Lucrezia Starch, MD 06/16/20 1719

## 2021-10-29 IMAGING — CT CT HEAD W/O CM
3 series · 15 of 47 positions shown, 18 images · non-contrast
Comparison: Head CT March 05, 2014; brain MRI March 06, 2014

CLINICAL DATA: Headache. Previous arteriovenous malformation with
coiling

EXAM:
CT HEAD WITHOUT CONTRAST
TECHNIQUE: Contiguous axial images were obtained from the base of the skull
through the vertex without intravenous contrast.

[Series 2: head wo · axial · 0.44mm/px · z∈[-170,-45]mm · 9 of 31 slices shown, 12 images]
[im 3/31  brain]
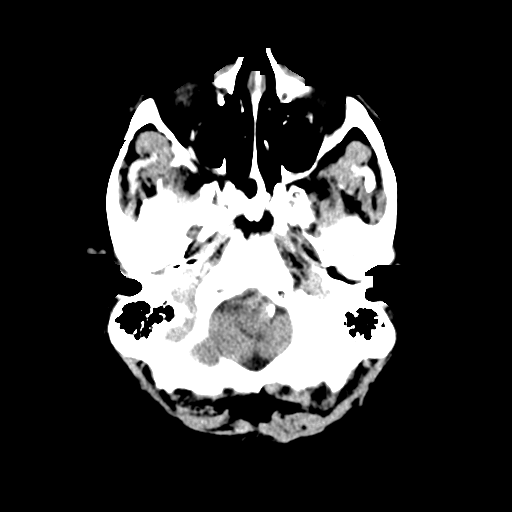
[im 3/31  bone]
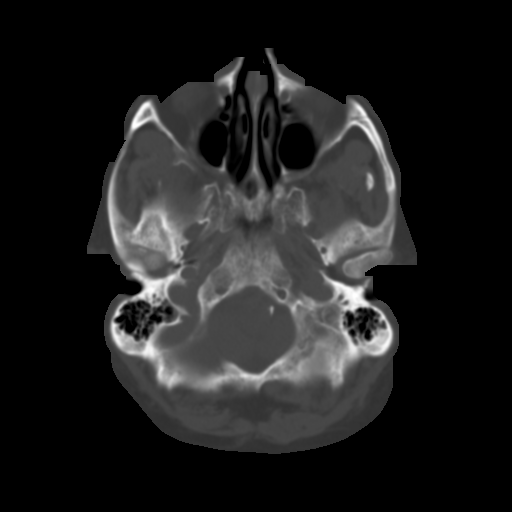
[im 6/31  brain]
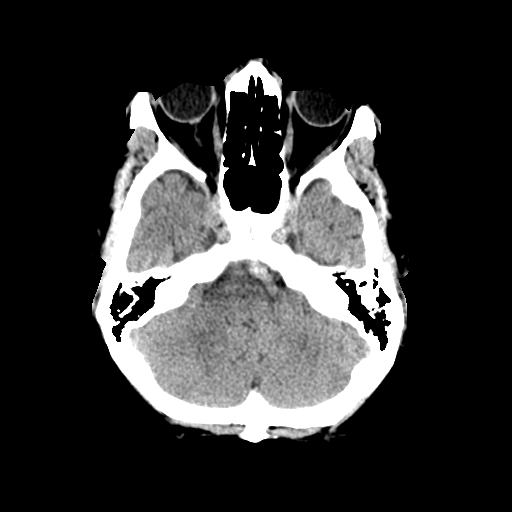
[im 9/31  brain]
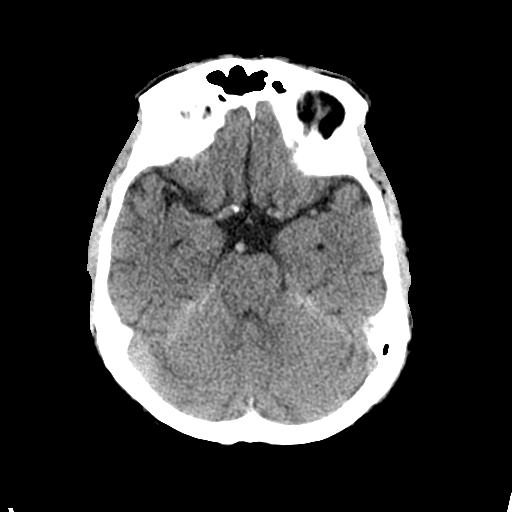
[im 12/31  brain]
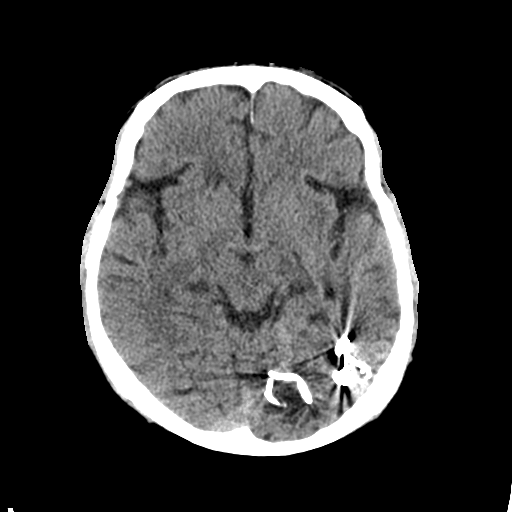
[im 16/31  brain]
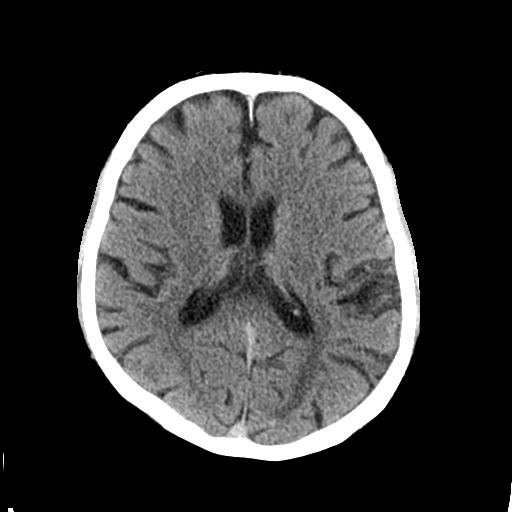
[im 16/31  bone]
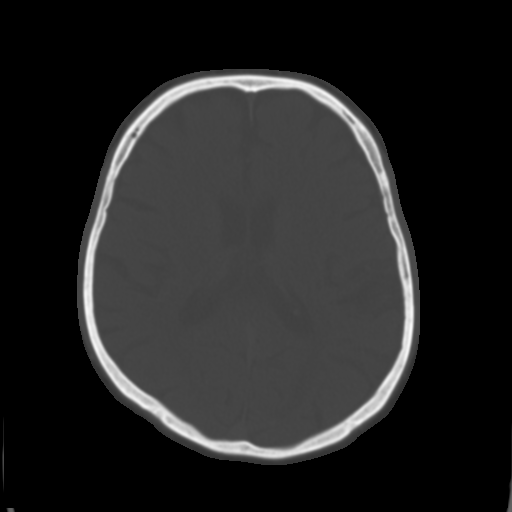
[im 19/31  brain]
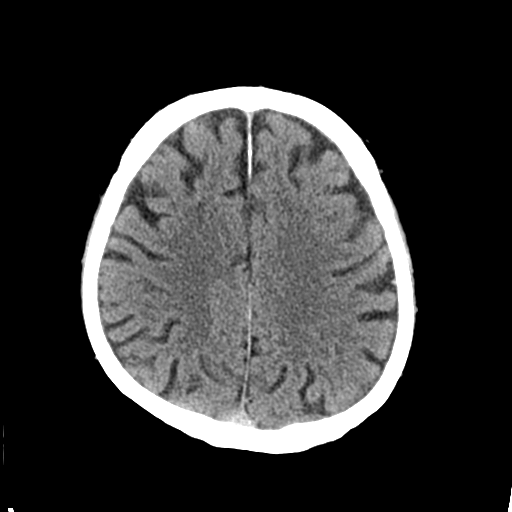
[im 22/31  brain]
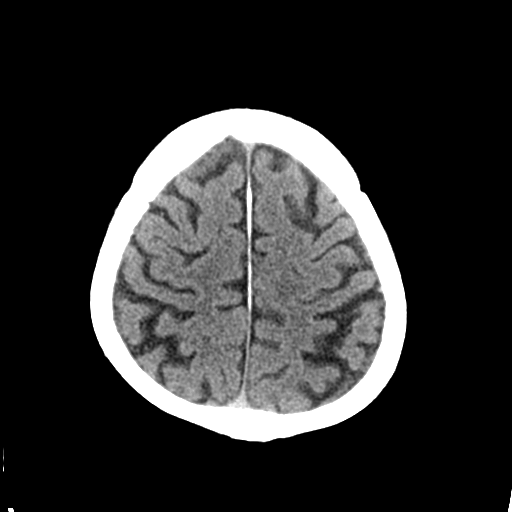
[im 25/31  brain]
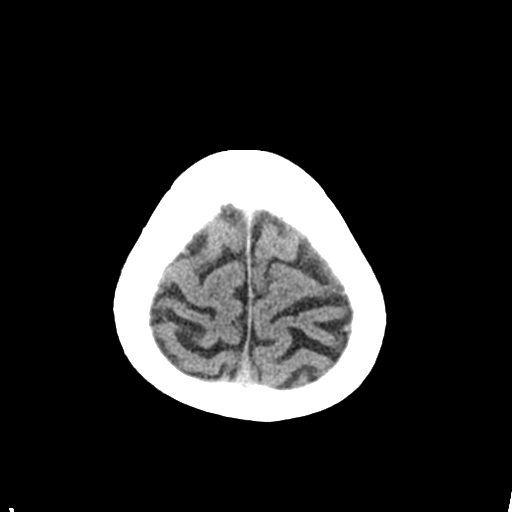
[im 28/31  brain]
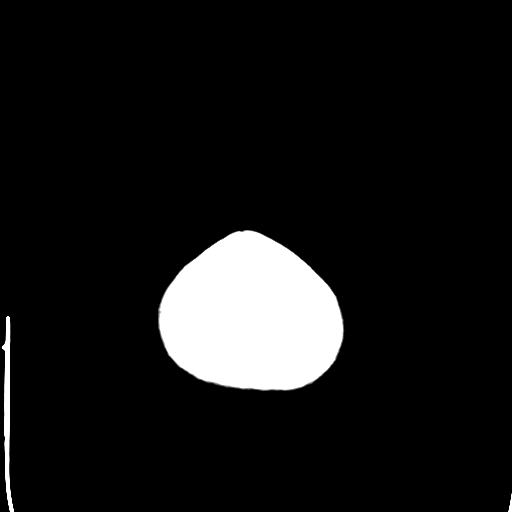
[im 28/31  bone]
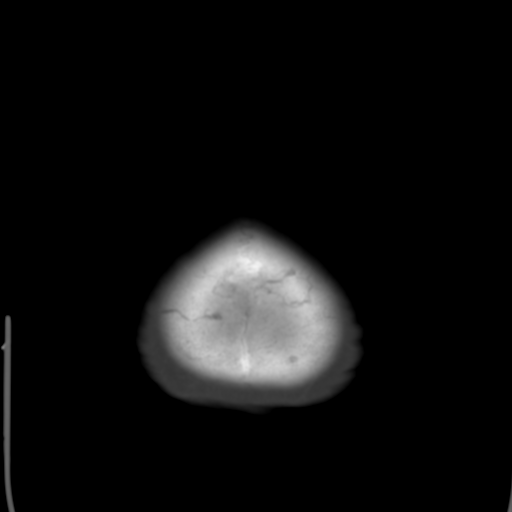

[Series 4: coronal soft tissue · coronal · 0.29mm/px · 3 of 68 slices shown]
[im 23/68  brain]
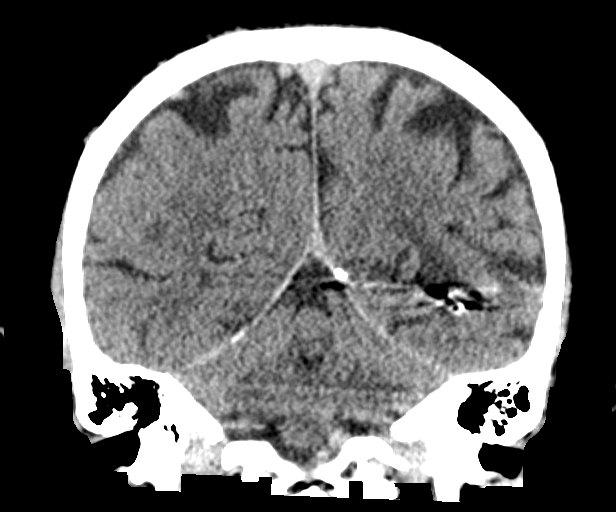
[im 30/68  brain]
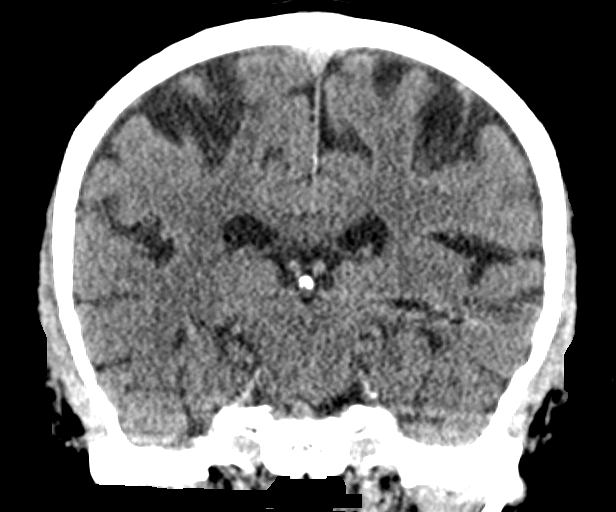
[im 38/68  brain]
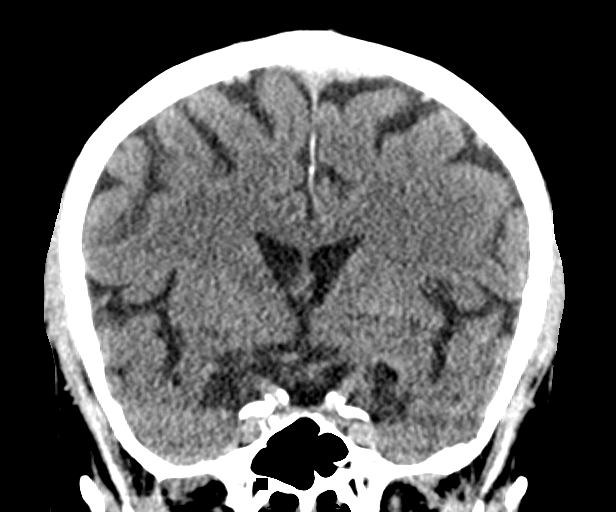

[Series 5: sagittal soft tissue · sagittal · 0.29mm/px · 3 of 59 slices shown]
[im 20/59  brain]
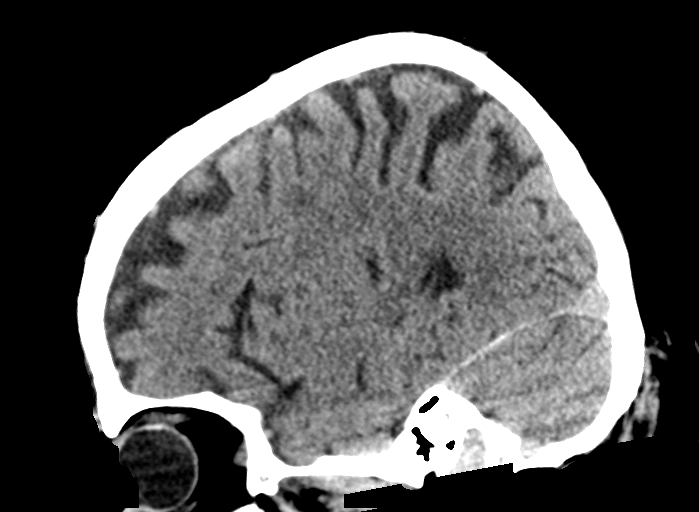
[im 30/59  brain]
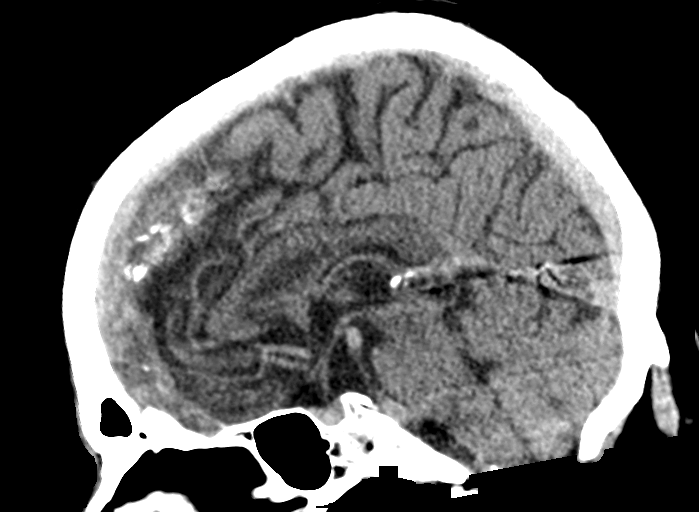
[im 39/59  brain]
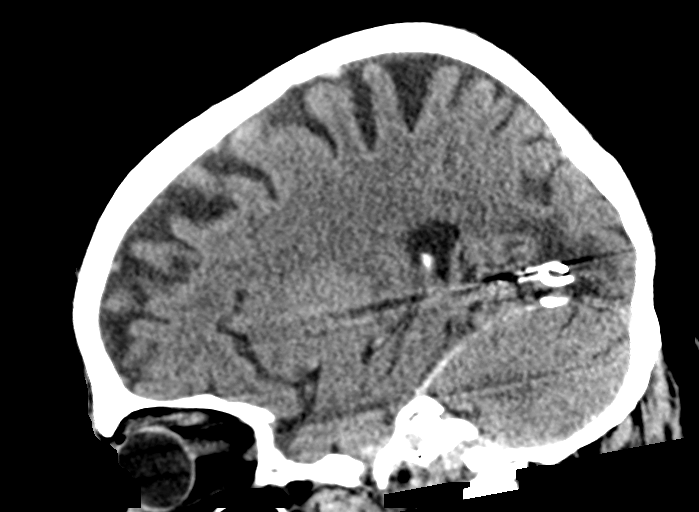

[15 of 47 positions shown; findings below may reference images not displayed]

FINDINGS: Brain: There is age related volume loss. There is no intracranial
mass, hemorrhage, extra-axial fluid collection, or midline shift.
There is evidence of previous coil placement in the left occipital
lobe for prior arteriovenous malformation. There is localized
encephalomalacia in this area, stable. There is evidence of a prior
infarct in the left thalamus. There is evidence of a prior focal
infarct in the right anterior centrum semiovale adjacent to the
anterior horn of the right lateral ventricle. No acute appearing
infarct is demonstrable currently.

Vascular: No hyperdense vessel. There is calcification in each
carotid siphon region as well as at the distal left vertebral
artery.

Skull: The bony calvarium appears intact.

Sinuses/Orbits: Visualized paranasal sinuses are clear. Orbits
appear symmetric bilaterally.

Other: Visualized mastoid air cells are clear.
IMPRESSION: Evidence of previous arteriovenous malformation coiling in the left
occipital lobe with encephalomalacia in this area.

Evidence of prior infarcts in the left thalamus and immediately
adjacent to the frontal horn the right lateral ventricle. No acute
infarct evident. No mass or hemorrhage.

There are foci of arterial vascular calcification.

## 2023-04-05 ENCOUNTER — Inpatient Hospital Stay
Admission: RE | Admit: 2023-04-05 | Discharge: 2023-04-05 | Disposition: A | Discharge status: Home / Self Care | Payer: Self-pay | Source: Ambulatory Visit | Attending: Neurosurgery | Admitting: Neurosurgery

## 2023-04-05 ENCOUNTER — Other Ambulatory Visit: Payer: Self-pay | Admitting: Family Medicine

## 2023-04-05 DIAGNOSIS — Z049 Encounter for examination and observation for unspecified reason: Secondary | ICD-10-CM

## 2023-04-06 NOTE — Progress Notes (Signed)
 Referring Physician:  Lenon Joyce ORN, MD [address] [facility] [place],  [place] [address]  Primary Physician:  Charles Joyce ORN, MD  History of Present Illness: 04/08/2023 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  His last intervention was 2021.  He has had embolization twice as well as stereotactic radiosurgery.  He has no symptoms today.  He does have residual right visual field cut in his upper outer quadrant.  I have utilized the care everywhere function in epic to review the outside records available from external health systems.  Review of Systems:  A 10 point review of systems is negative, except for the pertinent positives and negatives detailed in the HPI.  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   Stroke (HCC)    TIA 2012   Visual loss     Past Surgical History: Past Surgical History:  Procedure Laterality Date   BRAIN AVM REPAIR  2011   RIGHT BRAIN EMBOLIZATION   BRAIN AVM REPAIR  2014   RIGHT BRAIN RE-EMBOLIZATION & EXCISION OF SPINAL ABSCESS   CARDIAC CATHETERIZATION     CATARACT EXTRACTION W/PHACO Right 09/11/2015   Procedure: CATARACT EXTRACTION PHACO AND INTRAOCULAR LENS PLACEMENT (IOC);  Surgeon: Charles Etienne, MD;  Location: [facility];  Service: Ophthalmology;  Laterality: Right;  sleep apnea   cataract surgery     COLONOSCOPY WITH PROPOFOL  N/A 02/20/2015   Procedure: COLONOSCOPY WITH PROPOFOL ;  Surgeon: Charles ONEIDA Holmes,  MD;  Location[facility];  Service: Endoscopy;  Laterality: N/A;   COLONOSCOPY WITH PROPOFOL  N/A 05/18/2016   Procedure: COLONOSCOPY WITH PROPOFOL ;  Surgeon: Charles ONEIDA Holmes, MD;  Location: [facility];  Service: Endoscopy;  Laterality: N/A;   PROSTATECTOMY  2008 / 2013   GREEN LIGHT LASER / PROSTATECTOMY   TONSILLECTOMY  1956    Allergies: Allergies as of 04/08/2023   (No Known Allergies)    Medications:  Current Outpatient Medications:    acetaminophen (TYLENOL ARTHRITIS PAIN) 650 MG CR tablet, Take 650 mg by mouth every 8 (eight) hours as needed for pain. BREAKFAST AND BEDTIME, Disp: , Rfl:    aspirin  325 MG tablet, Take 1 tablet (325 mg total) by mouth daily. (Patient taking differently: Take 325 mg by mouth daily. AM), Disp: 30 tablet, Rfl: 3   aspirin  EC 81 MG tablet, Take 81 mg by mouth daily. Swallow whole., Disp: , Rfl:    Cholecalciferol (VITAMIN D3) 2000 units TABS, Take by mouth. LUNCH, Disp: , Rfl:    metoprolol (TOPROL-XL) 200 MG 24 hr tablet, Take 200 mg by mouth daily. AM, Disp: , Rfl:    Multiple Vitamins-Minerals (ICAPS AREDS FORMULA PO), Take 2 capsules by mouth daily. BREAKFAST AND BEDTIME, Disp: , Rfl:    rosuvastatin (CRESTOR) 20 MG tablet, Take 1 tablet by mouth daily., Disp: , Rfl:    sertraline (ZOLOFT) 50 MG tablet,  Take 1 tablet by mouth daily., Disp: , Rfl:   Social History: Social History   Tobacco Use   Smoking status: Never   Smokeless tobacco: Never  Substance Use Topics   Alcohol use: Yes   Drug use: No    Family Medical History: Family History  Problem Relation Age of Onset   Diabetes Other        Mother   Alzheimer's disease Other        Mother    Physical Examination: Vitals:   04/08/23 1002  BP: 134/78    General: Patient is in no apparent distress. Attention to examination is appropriate.  Neck:   Supple.  Full range of motion.  Respiratory: Patient is breathing without any difficulty.   NEUROLOGICAL:     Awake,  alert, oriented to person, place, and time.  Speech is clear and fluent.   Cranial Nerves: Pupils equal round and reactive to light.  Facial tone is symmetric.  Facial sensation is symmetric. Shoulder shrug is symmetric. Tongue protrusion is midline.  There is no pronator drift.  Strength: Side Biceps Triceps Deltoid Interossei Grip Wrist Ext. Wrist Flex.  R 5 5 5 5 5 5 5   L 5 5 5 5 5 5 5    Side Iliopsoas Quads Hamstring PF DF EHL  R 5 5 5 5 5 5   L 5 5 5 5 5 5    Reflexes are 1+ and symmetric at the biceps, triceps, brachioradialis, patella and achilles.   Hoffman's is absent.   Bilateral upper and lower extremity sensation is intact to light touch.    No evidence of dysmetria noted.  Gait is normal.     Medical Decision Making  Imaging: Prior imaging reviewed.  I have personally reviewed the images and agree with the above interpretation.  Assessment and Plan: [name] is a pleasant 82 y.o. 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  He has no new symptoms.  He is here to establish care.    I will order an MRA of his head and neck as well as an MRI with and without contrast.  We will defer further decision-making till after that is performed.  I spent a total of 30 minutes in this patient's care today. This time was spent reviewing pertinent records including imaging studies, obtaining and confirming history, performing a directed evaluation, formulating and discussing my recommendations, and documenting the visit within the medical record.      Thank you for involving me in the care of this patient.      Charles Joyce K. Clois MD, [facility] Neurosurgery

## 2023-04-08 ENCOUNTER — Ambulatory Visit (INDEPENDENT_AMBULATORY_CARE_PROVIDER_SITE_OTHER): Payer: Medicare HMO | Admitting: Neurosurgery

## 2023-04-08 VITALS — BP 134/78 | Ht 64.0 in | Wt 183.6 lb

## 2023-04-08 DIAGNOSIS — Q283 Other malformations of cerebral vessels: Secondary | ICD-10-CM

## 2023-04-08 DIAGNOSIS — Q2730 Arteriovenous malformation, site unspecified: Secondary | ICD-10-CM

## 2023-04-28 ENCOUNTER — Ambulatory Visit
Admission: RE | Admit: 2023-04-28 | Discharge: 2023-04-28 | Disposition: A | Discharge status: Home / Self Care | Payer: Medicare HMO | Source: Ambulatory Visit | Attending: Neurosurgery | Admitting: Neurosurgery

## 2023-04-28 ENCOUNTER — Other Ambulatory Visit (HOSPITAL_COMMUNITY): Payer: TRICARE For Life (TFL)

## 2023-04-28 ENCOUNTER — Ambulatory Visit
Admission: RE | Admit: 2023-04-28 | Discharge: 2023-04-28 | Discharge status: Home / Self Care | Payer: Medicare HMO | Source: Ambulatory Visit | Attending: Neurosurgery | Admitting: Neurosurgery

## 2023-04-28 DIAGNOSIS — Q283 Other malformations of cerebral vessels: Secondary | ICD-10-CM | POA: Insufficient documentation

## 2023-04-28 DIAGNOSIS — Q2730 Arteriovenous malformation, site unspecified: Secondary | ICD-10-CM | POA: Insufficient documentation

## 2023-04-28 MED ORDER — GADOBUTROL 1 MMOL/ML IV SOLN
7.5000 mL | Freq: Once | INTRAVENOUS | Status: AC | PRN
  Administered 2023-04-28: 7.5 mL via INTRAVENOUS

## 2023-05-14 ENCOUNTER — Encounter: Payer: Self-pay | Admitting: Neurosurgery

## 2024-04-04 NOTE — Addendum Note (Signed)
 Encounter addended by: Isay Perleberg L on: 04/04/2024 9:23 AM  Actions taken: Imaging Exam ended

## 2024-04-04 NOTE — Addendum Note (Signed)
 Encounter addended by: Talaya Lamprecht L on: 04/04/2024 9:22 AM  Actions taken: Imaging Exam ended
